# Patient Record
Sex: Male | Born: 1955
Health system: Southern US, Community
[De-identification: ages and names within clinical notes are randomized; demographics above are authoritative.]

## PROBLEM LIST (undated history)

## (undated) DIAGNOSIS — C6290 Malignant neoplasm of unspecified testis, unspecified whether descended or undescended: Secondary | ICD-10-CM

## (undated) DIAGNOSIS — Z8619 Personal history of other infectious and parasitic diseases: Secondary | ICD-10-CM

## (undated) DIAGNOSIS — B1920 Unspecified viral hepatitis C without hepatic coma: Secondary | ICD-10-CM

## (undated) DIAGNOSIS — I10 Essential (primary) hypertension: Secondary | ICD-10-CM

## (undated) HISTORY — DX: Personal history of other infectious and parasitic diseases: Z86.19

## (undated) HISTORY — DX: Unspecified viral hepatitis C without hepatic coma: B19.20

---

## 1992-06-26 HISTORY — PX: TESTICLE REMOVAL: SHX68

## 2005-03-14 ENCOUNTER — Ambulatory Visit: Payer: Self-pay | Admitting: Gastroenterology

## 2005-04-11 ENCOUNTER — Ambulatory Visit: Payer: Self-pay | Admitting: Gastroenterology

## 2005-06-15 ENCOUNTER — Ambulatory Visit: Payer: Self-pay | Admitting: Gastroenterology

## 2005-06-29 ENCOUNTER — Ambulatory Visit: Payer: Self-pay | Admitting: Gastroenterology

## 2006-03-22 ENCOUNTER — Ambulatory Visit: Payer: Self-pay | Admitting: Gastroenterology

## 2006-04-26 ENCOUNTER — Ambulatory Visit: Payer: Self-pay | Admitting: Gastroenterology

## 2006-12-11 ENCOUNTER — Ambulatory Visit: Payer: Self-pay | Admitting: Gastroenterology

## 2007-10-17 ENCOUNTER — Ambulatory Visit: Payer: Self-pay | Admitting: Gastroenterology

## 2009-07-12 ENCOUNTER — Ambulatory Visit: Payer: Self-pay | Admitting: Family Medicine

## 2009-11-11 ENCOUNTER — Ambulatory Visit: Payer: Self-pay | Admitting: Gastroenterology

## 2009-11-11 HISTORY — PX: LIVER BIOPSY: SHX301

## 2013-03-28 ENCOUNTER — Ambulatory Visit: Payer: Self-pay | Admitting: Gastroenterology

## 2013-09-08 ENCOUNTER — Ambulatory Visit: Payer: Self-pay | Admitting: Gastroenterology

## 2013-10-03 ENCOUNTER — Ambulatory Visit: Payer: Self-pay | Admitting: Gastroenterology

## 2013-10-06 LAB — HM COLONOSCOPY

## 2013-10-07 LAB — PATHOLOGY REPORT

## 2014-03-03 DIAGNOSIS — Z86010 Personal history of colonic polyps: Secondary | ICD-10-CM | POA: Insufficient documentation

## 2014-03-03 DIAGNOSIS — Z860101 Personal history of adenomatous and serrated colon polyps: Secondary | ICD-10-CM | POA: Insufficient documentation

## 2014-03-03 DIAGNOSIS — Z8619 Personal history of other infectious and parasitic diseases: Secondary | ICD-10-CM | POA: Insufficient documentation

## 2014-03-04 ENCOUNTER — Ambulatory Visit: Payer: Self-pay | Admitting: Gastroenterology

## 2014-03-13 LAB — BASIC METABOLIC PANEL
BUN: 16 mg/dL (ref 4–21)
Creatinine: 1.1 mg/dL (ref 0.6–1.3)
Glucose: 91 mg/dL
Potassium: 5 mmol/L (ref 3.4–5.3)
Sodium: 142 mmol/L (ref 137–147)

## 2014-03-13 LAB — HEPATIC FUNCTION PANEL
ALT: 28 U/L (ref 10–40)
AST: 25 U/L (ref 14–40)

## 2014-03-13 LAB — PSA: PSA: 0.3

## 2015-04-21 ENCOUNTER — Other Ambulatory Visit: Payer: Self-pay | Admitting: Gastroenterology

## 2015-04-21 DIAGNOSIS — K7400 Hepatic fibrosis, unspecified: Secondary | ICD-10-CM

## 2015-09-28 ENCOUNTER — Emergency Department: Payer: BLUE CROSS/BLUE SHIELD

## 2015-09-28 ENCOUNTER — Encounter: Payer: Self-pay | Admitting: Emergency Medicine

## 2015-09-28 ENCOUNTER — Observation Stay
Admission: EM | Admit: 2015-09-28 | Discharge: 2015-09-29 | Disposition: A | Discharge status: Home / Self Care | Payer: BLUE CROSS/BLUE SHIELD | Attending: Internal Medicine | Admitting: Internal Medicine

## 2015-09-28 ENCOUNTER — Encounter
Admission: EM | Disposition: A | Discharge status: Home / Self Care | Payer: Self-pay | Source: Home / Self Care | Attending: Emergency Medicine

## 2015-09-28 ENCOUNTER — Inpatient Hospital Stay
Admit: 2015-09-28 | Discharge: 2015-09-28 | Disposition: A | Discharge status: Home / Self Care | Payer: BLUE CROSS/BLUE SHIELD | Attending: Internal Medicine | Admitting: Internal Medicine

## 2015-09-28 DIAGNOSIS — R000 Tachycardia, unspecified: Secondary | ICD-10-CM | POA: Insufficient documentation

## 2015-09-28 DIAGNOSIS — I493 Ventricular premature depolarization: Secondary | ICD-10-CM | POA: Diagnosis not present

## 2015-09-28 DIAGNOSIS — R079 Chest pain, unspecified: Secondary | ICD-10-CM | POA: Diagnosis present

## 2015-09-28 DIAGNOSIS — I214 Non-ST elevation (NSTEMI) myocardial infarction: Secondary | ICD-10-CM | POA: Diagnosis not present

## 2015-09-28 DIAGNOSIS — R42 Dizziness and giddiness: Secondary | ICD-10-CM | POA: Diagnosis not present

## 2015-09-28 DIAGNOSIS — I25110 Atherosclerotic heart disease of native coronary artery with unstable angina pectoris: Secondary | ICD-10-CM | POA: Insufficient documentation

## 2015-09-28 DIAGNOSIS — E785 Hyperlipidemia, unspecified: Secondary | ICD-10-CM | POA: Diagnosis not present

## 2015-09-28 DIAGNOSIS — R0602 Shortness of breath: Secondary | ICD-10-CM | POA: Insufficient documentation

## 2015-09-28 DIAGNOSIS — R778 Other specified abnormalities of plasma proteins: Secondary | ICD-10-CM | POA: Insufficient documentation

## 2015-09-28 DIAGNOSIS — E876 Hypokalemia: Secondary | ICD-10-CM | POA: Diagnosis not present

## 2015-09-28 DIAGNOSIS — R072 Precordial pain: Secondary | ICD-10-CM | POA: Insufficient documentation

## 2015-09-28 DIAGNOSIS — I255 Ischemic cardiomyopathy: Secondary | ICD-10-CM | POA: Diagnosis not present

## 2015-09-28 DIAGNOSIS — I371 Nonrheumatic pulmonary valve insufficiency: Secondary | ICD-10-CM | POA: Insufficient documentation

## 2015-09-28 DIAGNOSIS — I081 Rheumatic disorders of both mitral and tricuspid valves: Secondary | ICD-10-CM | POA: Diagnosis not present

## 2015-09-28 DIAGNOSIS — Z8547 Personal history of malignant neoplasm of testis: Secondary | ICD-10-CM | POA: Insufficient documentation

## 2015-09-28 DIAGNOSIS — Z8249 Family history of ischemic heart disease and other diseases of the circulatory system: Secondary | ICD-10-CM | POA: Diagnosis not present

## 2015-09-28 DIAGNOSIS — I10 Essential (primary) hypertension: Secondary | ICD-10-CM | POA: Diagnosis not present

## 2015-09-28 DIAGNOSIS — R61 Generalized hyperhidrosis: Secondary | ICD-10-CM | POA: Insufficient documentation

## 2015-09-28 HISTORY — DX: Malignant neoplasm of unspecified testis, unspecified whether descended or undescended: C62.90

## 2015-09-28 HISTORY — PX: CARDIAC CATHETERIZATION: SHX172

## 2015-09-28 HISTORY — DX: Essential (primary) hypertension: I10

## 2015-09-28 LAB — BASIC METABOLIC PANEL
Anion gap: 10 (ref 5–15)
BUN: 16 mg/dL (ref 6–20)
CO2: 21 mmol/L — ABNORMAL LOW (ref 22–32)
Calcium: 8.7 mg/dL — ABNORMAL LOW (ref 8.9–10.3)
Chloride: 104 mmol/L (ref 101–111)
Creatinine, Ser: 0.89 mg/dL (ref 0.61–1.24)
GFR calc Af Amer: >60 mL/min (ref >60)
GFR calc non Af Amer: >60 mL/min (ref >60)
Glucose, Bld: 120 mg/dL — ABNORMAL HIGH (ref 65–99)
Potassium: 3.3 mmol/L — ABNORMAL LOW (ref 3.5–5.1)
Sodium: 135 mmol/L (ref 135–145)

## 2015-09-28 LAB — TROPONIN I
Troponin I: 0.06 ng/mL — ABNORMAL HIGH (ref <0.031)
Troponin I: 0.05 ng/mL — ABNORMAL HIGH (ref <0.031)

## 2015-09-28 LAB — CBC
HCT: 42.6 % (ref 40.0–52.0)
Hemoglobin: 14.6 g/dL (ref 13.0–18.0)
MCH: 31.7 pg (ref 26.0–34.0)
MCHC: 34.2 g/dL (ref 32.0–36.0)
MCV: 92.8 fL (ref 80.0–100.0)
Platelets: 137 10*3/uL — ABNORMAL LOW (ref 150–440)
RBC: 4.59 MIL/uL (ref 4.40–5.90)
RDW: 13.4 % (ref 11.5–14.5)
WBC: 12.5 10*3/uL — ABNORMAL HIGH (ref 3.8–10.6)

## 2015-09-28 SURGERY — LEFT HEART CATH AND CORONARY ANGIOGRAPHY
Anesthesia: Moderate Sedation | Laterality: Right

## 2015-09-28 MED ORDER — LISINOPRIL 20 MG PO TABS
20.0000 mg | ORAL_TABLET | Freq: Every day | ORAL | Status: DC
  Administered 2015-09-28 – 2015-09-29 (×2): 20 mg via ORAL
  Filled 2015-09-28 (×2): qty 1

## 2015-09-28 MED ORDER — SODIUM CHLORIDE 0.9% FLUSH
3.0000 mL | Freq: Two times a day (BID) | INTRAVENOUS | Status: DC
  Administered 2015-09-28: 3 mL via INTRAVENOUS

## 2015-09-28 MED ORDER — TICAGRELOR 90 MG PO TABS
ORAL_TABLET | ORAL | Status: AC
  Filled 2015-09-28: qty 2

## 2015-09-28 MED ORDER — ACETAMINOPHEN 325 MG PO TABS: 650.0000 mg | ORAL_TABLET | ORAL | Status: DC | PRN

## 2015-09-28 MED ORDER — HEPARIN (PORCINE) IN NACL 2-0.9 UNIT/ML-% IJ SOLN
INTRAMUSCULAR | Status: AC
  Filled 2015-09-28: qty 500

## 2015-09-28 MED ORDER — FENTANYL CITRATE (PF) 100 MCG/2ML IJ SOLN
INTRAMUSCULAR | Status: AC
  Filled 2015-09-28: qty 2

## 2015-09-28 MED ORDER — ASPIRIN 81 MG PO CHEW: 81.0000 mg | CHEWABLE_TABLET | ORAL | Status: DC

## 2015-09-28 MED ORDER — SODIUM CHLORIDE 0.9 % IV SOLN: 250.0000 mL | INTRAVENOUS | Status: DC | PRN

## 2015-09-28 MED ORDER — ASPIRIN 81 MG PO CHEW
CHEWABLE_TABLET | ORAL | Status: AC
  Filled 2015-09-28: qty 4

## 2015-09-28 MED ORDER — SODIUM CHLORIDE 0.9% FLUSH: 3.0000 mL | INTRAVENOUS | Status: DC | PRN

## 2015-09-28 MED ORDER — ONDANSETRON HCL 4 MG/2ML IJ SOLN: 4.0000 mg | Freq: Four times a day (QID) | INTRAMUSCULAR | Status: DC | PRN

## 2015-09-28 MED ORDER — IOPAMIDOL (ISOVUE-300) INJECTION 61%
INTRAVENOUS | Status: DC | PRN
  Administered 2015-09-28: 140 mL via INTRA_ARTERIAL

## 2015-09-28 MED ORDER — POTASSIUM CHLORIDE CRYS ER 20 MEQ PO TBCR
40.0000 meq | EXTENDED_RELEASE_TABLET | Freq: Once | ORAL | Status: AC
Start: 2015-09-28 — End: 2015-09-28
  Administered 2015-09-28: 40 meq via ORAL
  Filled 2015-09-28: qty 2

## 2015-09-28 MED ORDER — LABETALOL HCL 5 MG/ML IV SOLN
20.0000 mg | Freq: Once | INTRAVENOUS | Status: AC
  Administered 2015-09-28: 20 mg via INTRAVENOUS
  Filled 2015-09-28: qty 4

## 2015-09-28 MED ORDER — SODIUM CHLORIDE 0.9 % WEIGHT BASED INFUSION
1.0000 mL/kg/h | INTRAVENOUS | Status: AC
  Administered 2015-09-28: 1 mL/kg/h via INTRAVENOUS

## 2015-09-28 MED ORDER — SODIUM CHLORIDE 0.9 % WEIGHT BASED INFUSION: 1.0000 mL/kg/h | INTRAVENOUS | Status: DC

## 2015-09-28 MED ORDER — TICAGRELOR 90 MG PO TABS
ORAL_TABLET | ORAL | Status: DC | PRN
  Administered 2015-09-28: 180 mg via ORAL

## 2015-09-28 MED ORDER — NITROGLYCERIN 0.4 MG SL SUBL: 0.4000 mg | SUBLINGUAL_TABLET | SUBLINGUAL | Status: DC | PRN

## 2015-09-28 MED ORDER — ASPIRIN 81 MG PO CHEW: 324.0000 mg | CHEWABLE_TABLET | ORAL | Status: DC

## 2015-09-28 MED ORDER — MIDAZOLAM HCL 2 MG/2ML IJ SOLN
INTRAMUSCULAR | Status: AC
  Filled 2015-09-28: qty 2

## 2015-09-28 MED ORDER — TICAGRELOR 90 MG PO TABS
90.0000 mg | ORAL_TABLET | Freq: Two times a day (BID) | ORAL | Status: DC
  Administered 2015-09-28 – 2015-09-29 (×3): 90 mg via ORAL
  Filled 2015-09-28 (×3): qty 1

## 2015-09-28 MED ORDER — ASPIRIN 81 MG PO CHEW
324.0000 mg | CHEWABLE_TABLET | Freq: Once | ORAL | Status: AC
  Administered 2015-09-28: 324 mg via ORAL

## 2015-09-28 MED ORDER — ENOXAPARIN SODIUM 40 MG/0.4ML ~~LOC~~ SOLN
40.0000 mg | SUBCUTANEOUS | Status: DC
  Administered 2015-09-29: 40 mg via SUBCUTANEOUS
  Filled 2015-09-28: qty 0.4

## 2015-09-28 MED ORDER — ISOSORBIDE MONONITRATE ER 30 MG PO TB24
30.0000 mg | ORAL_TABLET | Freq: Every day | ORAL | Status: DC
  Administered 2015-09-28 – 2015-09-29 (×2): 30 mg via ORAL
  Filled 2015-09-28 (×2): qty 1

## 2015-09-28 MED ORDER — METOPROLOL TARTRATE 25 MG PO TABS
12.5000 mg | ORAL_TABLET | Freq: Two times a day (BID) | ORAL | Status: DC
  Administered 2015-09-28 – 2015-09-29 (×3): 12.5 mg via ORAL
  Filled 2015-09-28 (×5): qty 1

## 2015-09-28 MED ORDER — MIDAZOLAM HCL 2 MG/2ML IJ SOLN
INTRAMUSCULAR | Status: DC | PRN
  Administered 2015-09-28 (×2): 1 mg via INTRAVENOUS

## 2015-09-28 MED ORDER — LISINOPRIL 10 MG PO TABS
5.0000 mg | ORAL_TABLET | Freq: Every day | ORAL | Status: DC
Start: 2015-09-28 — End: 2015-09-28

## 2015-09-28 MED ORDER — ASPIRIN 300 MG RE SUPP: 300.0000 mg | RECTAL | Status: DC

## 2015-09-28 MED ORDER — FENTANYL CITRATE (PF) 100 MCG/2ML IJ SOLN
INTRAMUSCULAR | Status: DC | PRN
  Administered 2015-09-28: 50 ug via INTRAVENOUS

## 2015-09-28 MED ORDER — ATORVASTATIN CALCIUM 20 MG PO TABS
80.0000 mg | ORAL_TABLET | Freq: Every day | ORAL | Status: DC
  Administered 2015-09-28: 80 mg via ORAL
  Filled 2015-09-28: qty 4

## 2015-09-28 MED ORDER — SODIUM CHLORIDE 0.9 % WEIGHT BASED INFUSION: 3.0000 mL/kg/h | INTRAVENOUS | Status: DC

## 2015-09-28 MED ORDER — ASPIRIN EC 81 MG PO TBEC
81.0000 mg | DELAYED_RELEASE_TABLET | Freq: Every day | ORAL | Status: DC
  Administered 2015-09-29: 81 mg via ORAL
  Filled 2015-09-28: qty 1

## 2015-09-28 MED ORDER — NITROGLYCERIN 2 % TD OINT
TOPICAL_OINTMENT | TRANSDERMAL | Status: AC
  Filled 2015-09-28: qty 1

## 2015-09-28 MED ORDER — NITROGLYCERIN 2 % TD OINT
1.0000 [in_us] | TOPICAL_OINTMENT | Freq: Once | TRANSDERMAL | Status: AC
  Administered 2015-09-28: 1 [in_us] via TOPICAL

## 2015-09-28 SURGICAL SUPPLY — 10 items
CATH INFINITI 5FR ANG PIGTAIL (CATHETERS) ×2 IMPLANT
CATH INFINITI 5FR JL4 (CATHETERS) ×2 IMPLANT
CATH INFINITI JR4 5F (CATHETERS) ×2 IMPLANT
DEVICE CLOSURE MYNXGRIP 5F (Vascular Products) ×1 IMPLANT
KIT MANI 3VAL PERCEP (MISCELLANEOUS) ×2 IMPLANT
NDL PERC 18GX7CM (NEEDLE) ×1 IMPLANT
NEEDLE PERC 18GX7CM (NEEDLE) ×2 IMPLANT
PACK CARDIAC CATH (CUSTOM PROCEDURE TRAY) ×2 IMPLANT
SHEATH PINNACLE 5F 10CM (SHEATH) ×2 IMPLANT
WIRE EMERALD 3MM-J .035X150CM (WIRE) ×2 IMPLANT

## 2015-09-28 NOTE — Progress Notes (Signed)
Patient ambulated around the nursing station 3 times. Patient HR sustaining in the 80s. No complaints of pain or SOB. Will continue to monitor patient Bryan Shea

## 2015-09-28 NOTE — Consult Note (Signed)
Bryan Shea is a 60 y.o. male  [id number]  Primary Cardiologist: Neoma Laming Reason for Consultation: Chest Pain   HPI: Bryan Shea is a 60 year old male with known history of hypertension, testicular cancer who presented to the emergency room with chest pain. His chest pain was substernal in nature "like elephant sitting on my chest" and nonradiating severity of 10 out of 10 on a scale of 1-10, and accompanied by diaphoresis and shortness of breath. This had awakened him from a nap around 6 PM and subsequently improved with worsening later in the evening for which he presented to the emergency department. Pt without history of cardiac problems or MI. No bleeding disorders or recent unusual bleeding.   Review of Systems: Currently chest pain free, no shortness of breath, no dizziness, no palpitations   Past Medical History  Diagnosis Date  . Hypertension   . Testicular cancer ([place])     Medications Prior to Admission  Medication Sig Dispense Refill  . acetaminophen (TYLENOL) 325 MG tablet Take 650 mg by mouth every 6 (six) hours as needed.    . Multiple Vitamin (MULTIVITAMIN WITH MINERALS) TABS tablet Take 1 tablet by mouth daily.       Marland Kitchen aspirin  324 mg Oral NOW   Or  . aspirin  300 mg Rectal NOW  . [START ON 09/29/2015] aspirin EC  81 mg Oral Daily  . enoxaparin (LOVENOX) injection  40 mg Subcutaneous Q24H  . metoprolol tartrate  12.5 mg Oral BID  . sodium chloride flush  3 mL Intravenous Q12H    Infusions:    No Known Allergies  Social History   Social History  . Marital Status: Single    Spouse Name: N/A  . Number of Children: N/A  . Years of Education: N/A   Occupational History  . Works for [employer]    Social History Main Topics  . Smoking status: Never Smoker   . Smokeless tobacco: Never Used  . Alcohol Use: Yes  . Drug Use: No  . Sexual Activity: Not on file   Other Topics Concern  . Not on file   Social History Narrative  . No  narrative on file    Family History  Problem Relation Age of Onset  . Heart failure Father     deceased    PHYSICAL EXAM: Filed Vitals:   09/28/15 0730 09/28/15 0835  BP: 136/92 145/104  Pulse: 74 84  Temp:  98.1 F (36.7 C)  Resp: 20 18    No intake or output data in the 24 hours ending 09/28/15 0845  General:  Well appearing. No respiratory difficulty HEENT: normal Neck: supple. no JVD. Carotids 2+ bilat; no bruits. No lymphadenopathy or thryomegaly appreciated. Cor: PMI nondisplaced. Regular rate & rhythm. No rubs, gallops or murmurs. Lungs: clear Abdomen: soft, nontender, nondistended. No hepatosplenomegaly. No bruits or masses. Good bowel sounds. Extremities: no cyanosis, clubbing, rash, edema Neuro: alert & oriented x 3, cranial nerves grossly intact. moves all 4 extremities w/o difficulty. Affect pleasant.  ECG: Sinus rhythm with frequent PVC's  Results for orders placed or performed during the hospital encounter of 09/28/15 (from the past 24 hour(s))  Basic metabolic panel     Status: Abnormal   Collection Time: 09/28/15  1:35 AM  Result Value Ref Range   Sodium 135 135 - 145 mmol/L   Potassium 3.3 (L) 3.5 - 5.1 mmol/L   Chloride 104 101 - 111 mmol/L   CO2 21 (L) 22 -  32 mmol/L   Glucose, Bld 120 (H) 65 - 99 mg/dL   BUN 16 6 - 20 mg/dL   Creatinine, Ser 0.89 0.61 - 1.24 mg/dL   Calcium 8.7 (L) 8.9 - 10.3 mg/dL   GFR calc non Af Amer >60 >60 mL/min   GFR calc Af Amer >60 >60 mL/min   Anion gap 10 5 - 15  CBC     Status: Abnormal   Collection Time: 09/28/15  1:35 AM  Result Value Ref Range   WBC 12.5 (H) 3.8 - 10.6 K/uL   RBC 4.59 4.40 - 5.90 MIL/uL   Hemoglobin 14.6 13.0 - 18.0 g/dL   HCT 42.6 40.0 - 52.0 %   MCV 92.8 80.0 - 100.0 fL   MCH 31.7 26.0 - 34.0 pg   MCHC 34.2 32.0 - 36.0 g/dL   RDW 13.4 11.5 - 14.5 %   Platelets 137 (L) 150 - 440 K/uL  Troponin I     Status: Abnormal   Collection Time: 09/28/15  1:35 AM  Result Value Ref Range    Troponin I 0.06 (H) <0.031 ng/mL  Troponin I     Status: Abnormal   Collection Time: 09/28/15  4:23 AM  Result Value Ref Range   Troponin I 0.05 (H) <0.031 ng/mL   Dg Chest 2 View  09/28/2015  CLINICAL DATA:  Sternal chest pain and pressure, onset last evening. Dizziness. EXAM: CHEST  2 VIEW COMPARISON:  None. FINDINGS: The cardiomediastinal contours are normal, heart at the upper limits of normal in size. The lungs are clear. Pulmonary vasculature is normal. No consolidation, pleural effusion, or pneumothorax. No acute osseous abnormalities are seen. Degenerative change in the included lumbar spine. IMPRESSION: No acute pulmonary process. Electronically Signed   By: Jeb Levering M.D.   On: 09/28/2015 02:23     ASSESSMENT AND PLAN: Unstable angina with new onset severe substernal chest pain associated with shortness of breath and diaphoresis. Mildly elevated troponins, inappropriate for stress test. Will proceed with left heart cath and angiography. Discussed with patient and he agrees with plan.  Daune Perch, NP 09/28/2015 8:45 AM

## 2015-09-28 NOTE — ED Provider Notes (Signed)
Time Seen: Approximately 305  I have reviewed the triage notes  Chief Complaint: Chest Pain   History of Present Illness: Bryan Shea is a 60 y.o. male who presents with 2 separate episodes of substernal chest discomfort. Patient states he got home from work yesterday afternoon at about 6:00 laid down to take a nap. He states he was awoken from the nap with a feeling of chest heaviness and pressure on his chest. He denies any radiation of discomfort to the arm or jaw area or to the back. Use and states that he was diaphoretic at that time with some nausea and mild shortness of breath. He states he took some neck serum and the symptoms seemed to resolve and then he went to bed and states he started having return of the chest pressure approximately 1 AM. He states at work he said some shortness of breath with exertion which is somewhat new for him over the past week. He has a history of hypertension and was on lisinopril but when his insurance changed to stop taking the medication. He denies any significant discomfort at this time just a mild pressure. He denies any current nausea or vomiting. He states he has not had any objective studies to evaluate his heart.   Past Medical History  Diagnosis Date  . Hypertension   . Testicular cancer ([place])     There are no active problems to display for this patient.   Past Surgical History  Procedure Laterality Date  . Testicle removal      Past Surgical History  Procedure Laterality Date  . Testicle removal      No current outpatient prescriptions on file.  Allergies:  Review of patient's allergies indicates no known allergies.  Family History: No family history on file.  Social History: Social History  Substance Use Topics  . Smoking status: Never Smoker   . Smokeless tobacco: Never Used  . Alcohol Use: Yes     Review of Systems:   10 point review of systems was performed and was otherwise negative:  Constitutional:  No fever Eyes: No visual disturbances ENT: No sore throat, ear pain Cardiac: Mild substernal chest pressure that has a slight pleuritic component Respiratory: No shortness of breath, wheezing, or stridor Abdomen: No abdominal pain, no vomiting, No diarrhea Endocrine: No weight loss, No night sweats Extremities: No peripheral edema, cyanosis Skin: No rashes, easy bruising Neurologic: No focal weakness, trouble with speech or swollowing Urologic: No dysuria, Hematuria, or urinary frequency   Physical Exam:  ED Triage Vitals  Enc Vitals Group     BP 09/28/15 0255 173/126 mmHg     Pulse Rate 09/28/15 0255 99     Resp 09/28/15 0255 24     Temp --      Temp src --      SpO2 09/28/15 0255 97 %     Weight 09/28/15 0304 230 lb (104.327 kg)     Height 09/28/15 0304 6\' 3"  (1.905 m)     Head Cir --      Peak Flow --      Pain Score 09/28/15 0127 6     Pain Loc --      Pain Edu? --      Excl. in [place]? --     General: Awake , Alert , and Oriented times 3; GCS 15 Head: Normal cephalic , atraumatic Eyes: Pupils equal , round, reactive to light Nose/Throat: No nasal drainage, patent upper airway without erythema  or exudate.  Neck: Supple, Full range of motion, No anterior adenopathy or palpable thyroid masses Lungs: Clear to ascultation without wheezes , rhonchi, or rales Heart: Regular rate, regular rhythm without murmurs , gallops , or rubs Abdomen: Soft, non tender without rebound, guarding , or rigidity; bowel sounds positive and symmetric in all 4 quadrants. No organomegaly .        Extremities: 2 plus symmetric pulses. No edema, clubbing or cyanosis Neurologic: normal ambulation, Motor symmetric without deficits, sensory intact Skin: warm, dry, no rashes There is mild reproducible pain with palpation across his sternal region  Labs:   All laboratory work was reviewed including any pertinent negatives or positives listed below:  Labs Reviewed  BASIC METABOLIC PANEL - Abnormal;  Notable for the following:    Potassium 3.3 (*)    CO2 21 (*)    Glucose, Bld 120 (*)    Calcium 8.7 (*)    All other components within normal limits  CBC - Abnormal; Notable for the following:    WBC 12.5 (*)    Platelets 137 (*)    All other components within normal limits  TROPONIN I - Abnormal; Notable for the following:    Troponin I 0.06 (*)    All other components within normal limits    EKG: * ED ECG REPORT I, Daymon Larsen, the attending physician, personally viewed and interpreted this ECG.  Date: 09/28/2015 EKG Time: 0125 Rate: 106 Rhythm: Sinus tachycardia with frequent unifocal PVCs QRS Axis: normal Intervals: normal ST/T Wave abnormalities: normal Conduction Disturbances: none Narrative Interpretation:  Left atrial enlargement Left ventricular hypertrophy with left axis deviation  Radiology:  EXAM: CHEST 2 VIEW  COMPARISON: None.  FINDINGS: The cardiomediastinal contours are normal, heart at the upper limits of normal in size. The lungs are clear. Pulmonary vasculature is normal. No consolidation, pleural effusion, or pneumothorax. No acute osseous abnormalities are seen. Degenerative change in the included lumbar spine.  IMPRESSION: No acute pulmonary process.     I personally reviewed the radiologic studies    ED Course:  Patient's stay here was uneventful he arrives hypertensive with some mild chest discomfort he had an inch of nitroglycerin paste applied and aspirin therapy. He still remained hypertensive and was given labetalol 20 mg IV. Heme to respond very well to the medication at the time of disposition his blood pressure is 132/92. Serial troponins were performed and remained elevated though decreased slightly on the second troponin. His history is concerning for acute coronary syndrome. The patient's case was reviewed with the hospitalist team, further disposition and management depends upon their evaluation.   Assessment: * Acute  unspecified chest pain Hypertensive urgency      Plan: * Inpatient management           Daymon Larsen, MD 09/28/15 [phone]

## 2015-09-28 NOTE — H&P (Signed)
[facility] [place] NAME: Bryan Shea    MR#:  [id number]  DATE OF BIRTH:  [birth year]  DATE OF ADMISSION:  09/28/2015  PRIMARY CARE PHYSICIAN: Lelon Huh, MD   REQUESTING/REFERRING PHYSICIAN:   CHIEF COMPLAINT:   Chief Complaint  Patient presents with  . Chest Pain    HISTORY OF PRESENT ILLNESS: Bryan Shea  is a 60 y.o. male with a known history of Hypertension, testicular cancer presented to the emergency room with chest pain since yesterday. Patient came back from work and he took a nap and after waking up he had some retrosternal chest pain. He felt pressure-like sensation in the center of the chest which was 10 out of 10 on a scale of 1-10. No radiation of the pain noted. No history of cough fever or chills. No history of any shortness of breath. No orthopnea or proximal nocturnal dyspnea. Patient never had any cardiac workup done in the past. Troponin first 2 sets have been borderline.  PAST MEDICAL HISTORY:   Past Medical History  Diagnosis Date  . Hypertension   . Testicular cancer ([place])     PAST SURGICAL HISTORY: Past Surgical History  Procedure Laterality Date  . Testicle removal      SOCIAL HISTORY:  Social History  Substance Use Topics  . Smoking status: Never Smoker   . Smokeless tobacco: Never Used  . Alcohol Use: Yes    FAMILY HISTORY:  Family History  Problem Relation Age of Onset  . Heart failure Father     deceased    DRUG ALLERGIES: No Known Allergies  REVIEW OF SYSTEMS:   CONSTITUTIONAL: No fever, fatigue or weakness.  EYES: No blurred or double vision.  EARS, NOSE, AND THROAT: No tinnitus or ear pain.  RESPIRATORY: No cough, shortness of breath, wheezing or hemoptysis.  CARDIOVASCULAR: Has chest pain, no orthopnea,no edema.  GASTROINTESTINAL: No nausea, vomiting, diarrhea or abdominal pain.  GENITOURINARY: No dysuria, hematuria.  ENDOCRINE: No polyuria, nocturia,  HEMATOLOGY:  No anemia, easy bruising or bleeding SKIN: No rash or lesion. MUSCULOSKELETAL: No joint pain or arthritis.   NEUROLOGIC: No tingling, numbness, weakness.  PSYCHIATRY: No anxiety or depression.   MEDICATIONS AT HOME:  Prior to Admission medications   Not on File      PHYSICAL EXAMINATION:   VITAL SIGNS: Blood pressure 133/96, pulse 76, resp. rate 16, height 6\' 3"  (1.905 m), weight 104.327 kg (230 lb), SpO2 95 %.  GENERAL:  60 y.o.-year-old patient lying in the bed with no acute distress.  EYES: Pupils equal, round, reactive to light and accommodation. No scleral icterus. Extraocular muscles intact.  HEENT: Head atraumatic, normocephalic. Oropharynx and nasopharynx clear.  NECK:  Supple, no jugular venous distention. No thyroid enlargement, no tenderness.  LUNGS: Normal breath sounds bilaterally, no wheezing, rales,rhonchi or crepitation. No use of accessory muscles of respiration.  CARDIOVASCULAR: S1, S2 normal. No murmurs, rubs, or gallops.  ABDOMEN: Soft, nontender, nondistended. Bowel sounds present. No organomegaly or mass.  EXTREMITIES: No pedal edema, cyanosis, or clubbing.  NEUROLOGIC: Cranial nerves II through XII are intact. Muscle strength 5/5 in all extremities. Sensation intact. Gait not checked.  PSYCHIATRIC: The patient is alert and oriented x 3.  SKIN: No obvious rash, lesion, or ulcer.   LABORATORY PANEL:   CBC  Recent Labs Lab 09/28/15 0135  WBC 12.5*  HGB 14.6  HCT 42.6  PLT 137*  MCV 92.8  MCH 31.7  MCHC 34.2  RDW  13.4   ------------------------------------------------------------------------------------------------------------------  Chemistries   Recent Labs Lab 09/28/15 0135  NA 135  K 3.3*  CL 104  CO2 21*  GLUCOSE 120*  BUN 16  CREATININE 0.89  CALCIUM 8.7*   ------------------------------------------------------------------------------------------------------------------ estimated creatinine clearance is 118.2 mL/min (by C-G formula  based on Cr of 0.89). ------------------------------------------------------------------------------------------------------------------ No results for input(s): TSH, T4TOTAL, T3FREE, THYROIDAB in the last 72 hours.  Invalid input(s): FREET3   Coagulation profile No results for input(s): INR, PROTIME in the last 168 hours. ------------------------------------------------------------------------------------------------------------------- No results for input(s): DDIMER in the last 72 hours. -------------------------------------------------------------------------------------------------------------------  Cardiac Enzymes  Recent Labs Lab 09/28/15 0135 09/28/15 0423  TROPONINI 0.06* 0.05*   ------------------------------------------------------------------------------------------------------------------ Invalid input(s): POCBNP  ---------------------------------------------------------------------------------------------------------------  Urinalysis No results found for: COLORURINE, APPEARANCEUR, LABSPEC, PHURINE, GLUCOSEU, HGBUR, BILIRUBINUR, KETONESUR, PROTEINUR, UROBILINOGEN, NITRITE, LEUKOCYTESUR   RADIOLOGY: Dg Chest 2 View  09/28/2015  CLINICAL DATA:  Sternal chest pain and pressure, onset last evening. Dizziness. EXAM: CHEST  2 VIEW COMPARISON:  None. FINDINGS: The cardiomediastinal contours are normal, heart at the upper limits of normal in size. The lungs are clear. Pulmonary vasculature is normal. No consolidation, pleural effusion, or pneumothorax. No acute osseous abnormalities are seen. Degenerative change in the included lumbar spine. IMPRESSION: No acute pulmonary process. Electronically Signed   By: Jeb Levering M.D.   On: 09/28/2015 02:23    EKG: Orders placed or performed during the hospital encounter of 09/28/15  . EKG 12-Lead  . EKG 12-Lead  . ED EKG within 10 minutes  . ED EKG within 10 minutes    IMPRESSION AND PLAN: 60 year old male patient with  history of hypertension, testicular cancer presented to the emergency room with chest pain. Admitting diagnosis 1. Chest pain rule out acute coronary syndrome 2. Hypokalemia 3.Hypertension Treatment plan Admit patient to telemetry observation bed Aspirin 81 mg daily Replace oral potassium Control blood pressure with oral metoprolol Check troponin and echocardiogram Cardiology consultation.    All the records are reviewed and case discussed with ED provider. Management plans discussed with the patient, family and they are in agreement.  CODE STATUS:FULL Code Status History    This patient does not have a recorded code status. Please follow your organizational policy for patients in this situation.       TOTAL TIME TAKING CARE OF THIS PATIENT: 50 minutes.    Saundra Shelling M.D on 09/28/2015 at 6:12 AM  Between 7am to 6pm - Pager - [phone]  After 6pm go to [email or web address] - password [email or web address] [facility]  Office  [phone]  CC: Primary care physician; Lelon Huh, MD

## 2015-09-28 NOTE — Progress Notes (Signed)
Patient ID: Bryan Shea, male   DOB: 1956-01-30, 60 y.o.   MRN: [id number] [facility] PROGRESS NOTE  Bryan Shea [id number] DOB: 24-May-1956 DOA: 09/28/2015 PCP: Lelon Huh, MD  HPI/Subjective: Patient feeling better now on offers no complaints. He did have chest pain that brought him into the hospital.  Objective: Filed Vitals:   09/28/15 1119 09/28/15 1217  BP: 116/81 126/88  Pulse: 70 70  Temp: 97.9 F (36.6 C) 98.2 F (36.8 C)  Resp:  18   No intake or output data in the 24 hours ending 09/28/15 1542 Filed Weights   09/28/15 0304  Weight: 104.327 kg (230 lb)    ROS: Review of Systems  Constitutional: Negative for fever and chills.  Eyes: Negative for blurred vision.  Respiratory: Negative for cough and shortness of breath.   Cardiovascular: Positive for chest pain.  Gastrointestinal: Negative for nausea, vomiting, abdominal pain, diarrhea and constipation.  Genitourinary: Negative for dysuria.  Musculoskeletal: Negative for joint pain.  Neurological: Negative for dizziness and headaches.   Exam: Physical Exam  Constitutional: He is oriented to person, place, and time.  HENT:  Nose: No mucosal edema.  Mouth/Throat: No oropharyngeal exudate or posterior oropharyngeal edema.  Eyes: Conjunctivae, EOM and lids are normal. Pupils are equal, round, and reactive to light.  Neck: No JVD present. Carotid bruit is not present. No edema present. No thyroid mass and no thyromegaly present.  Cardiovascular: S1 normal and S2 normal.  Exam reveals no gallop.   No murmur heard. Pulses:      Dorsalis pedis pulses are 2+ on the right side, and 2+ on the left side.  Respiratory: No respiratory distress. He has no wheezes. He has no rhonchi. He has no rales.  GI: Soft. Bowel sounds are normal. There is no tenderness.  Musculoskeletal:       Right ankle: He exhibits no swelling.       Left ankle: He exhibits no swelling.  Lymphadenopathy:    He has no cervical  adenopathy.  Neurological: He is alert and oriented to person, place, and time. No cranial nerve deficit.  Skin: Skin is warm. No rash noted. Nails show no clubbing.  Psychiatric: He has a normal mood and affect.      Data Reviewed: Basic Metabolic Panel:  Recent Labs Lab 09/28/15 0135  NA 135  K 3.3*  CL 104  CO2 21*  GLUCOSE 120*  BUN 16  CREATININE 0.89  CALCIUM 8.7*   CBC:  Recent Labs Lab 09/28/15 0135  WBC 12.5*  HGB 14.6  HCT 42.6  MCV 92.8  PLT 137*   Cardiac Enzymes:  Recent Labs Lab 09/28/15 0135 09/28/15 0423  TROPONINI 0.06* 0.05*     Studies: Dg Chest 2 View  09/28/2015  CLINICAL DATA:  Sternal chest pain and pressure, onset last evening. Dizziness. EXAM: CHEST  2 VIEW COMPARISON:  None. FINDINGS: The cardiomediastinal contours are normal, heart at the upper limits of normal in size. The lungs are clear. Pulmonary vasculature is normal. No consolidation, pleural effusion, or pneumothorax. No acute osseous abnormalities are seen. Degenerative change in the included lumbar spine. IMPRESSION: No acute pulmonary process. Electronically Signed   By: Jeb Levering M.D.   On: 09/28/2015 02:23    Scheduled Meds: . [START ON 09/29/2015] aspirin EC  81 mg Oral Daily  . atorvastatin  80 mg Oral q1800  . enoxaparin (LOVENOX) injection  40 mg Subcutaneous Q24H  . isosorbide mononitrate  30 mg Oral Daily  .  lisinopril  20 mg Oral Daily  . metoprolol tartrate  12.5 mg Oral BID  . sodium chloride flush  3 mL Intravenous Q12H  . sodium chloride flush  3 mL Intravenous Q12H  . ticagrelor  90 mg Oral BID   Continuous Infusions: . sodium chloride 1 mL/kg/hr (09/28/15 1159)    Assessment/Plan:  1. Chest pain concerning for acute coronary syndrome. Troponin elevation. Patient had a catheter that showed 50% blockage in the LAD. Medical management. Aspirin, Brilinta, metoprolol and statin 2. Essential hypertension- blood pressure better controlled  now 3. Hypokalemia- replaced 4. History of testicular cancer  Code Status:     Code Status Orders        Start     Ordered   09/28/15 0841  Full code   Continuous     09/28/15 0840    Code Status History    Date Active Date Inactive Code Status Order ID Comments User Context   This patient has a current code status but no historical code status.     Disposition Plan: Home tomorrow  Consultants:  Cardiology  Time spent: 32 minutes  [place], [place]

## 2015-09-28 NOTE — ED Notes (Signed)
MD at bedside. 

## 2015-09-28 NOTE — ED Notes (Signed)
Pt presents to ED with mid-sternal chest pain/heaviness. Pt states he took a nap after work yesterday evening and was woken by pressure and heaviness in his chest around 1750. Pt reports he was diaphoretic and sob. Pt states symptoms have resolved very little and pt states they worsened again when he went to go to bed. No hx of the same.

## 2015-09-28 NOTE — ED Notes (Signed)
Pt resting in bed, eyes closed, resp even and unlabored, will continue to monitor closely

## 2015-09-28 NOTE — ED Notes (Signed)
Lab called to report elevated troponin of 0.06. Will room as soon as possible. MD notified.

## 2015-09-28 NOTE — Progress Notes (Signed)
Patient had cardiac cath which shows bifurcation mid LAD 50% disease, LCX/RCA normal and LVEF 40%. Advise aggressive medical therapy which has been started. May discharge patient tomoow as needs to watched on medical treatment.

## 2015-09-28 NOTE — Progress Notes (Signed)
Per Dr. Leslye Peer okay cancel troponin lab draws. Will continue to monitor pt. [name]

## 2015-09-29 ENCOUNTER — Telehealth: Payer: Self-pay | Admitting: Family Medicine

## 2015-09-29 ENCOUNTER — Encounter: Payer: Self-pay | Admitting: Family Medicine

## 2015-09-29 DIAGNOSIS — I2510 Atherosclerotic heart disease of native coronary artery without angina pectoris: Secondary | ICD-10-CM | POA: Insufficient documentation

## 2015-09-29 LAB — ECHOCARDIOGRAM COMPLETE
Height: 75 in
Weight: 3680 oz

## 2015-09-29 LAB — BASIC METABOLIC PANEL
Anion gap: 4 — ABNORMAL LOW (ref 5–15)
BUN: 19 mg/dL (ref 6–20)
CO2: 26 mmol/L (ref 22–32)
Calcium: 8.6 mg/dL — ABNORMAL LOW (ref 8.9–10.3)
Chloride: 107 mmol/L (ref 101–111)
Creatinine, Ser: 1.12 mg/dL (ref 0.61–1.24)
GFR calc Af Amer: >60 mL/min (ref >60)
GFR calc non Af Amer: >60 mL/min (ref >60)
Glucose, Bld: 119 mg/dL — ABNORMAL HIGH (ref 65–99)
Potassium: 4 mmol/L (ref 3.5–5.1)
Sodium: 137 mmol/L (ref 135–145)

## 2015-09-29 LAB — LIPID PANEL
Cholesterol: 138 mg/dL (ref 0–200)
HDL: 40 mg/dL — ABNORMAL LOW (ref >40)
LDL Cholesterol: 49 mg/dL (ref 0–99)
Total CHOL/HDL Ratio: 3.5 RATIO
Triglycerides: 243 mg/dL — ABNORMAL HIGH (ref <150)
VLDL: 49 mg/dL — ABNORMAL HIGH (ref 0–40)

## 2015-09-29 MED ORDER — CARVEDILOL 12.5 MG PO TABS
12.5000 mg | ORAL_TABLET | Freq: Two times a day (BID) | ORAL | Status: DC
  Administered 2015-09-29: 12.5 mg via ORAL
  Filled 2015-09-29: qty 1

## 2015-09-29 MED ORDER — METOPROLOL SUCCINATE ER 25 MG PO TB24: 25.0000 mg | ORAL_TABLET | Freq: Every day | ORAL | Status: DC

## 2015-09-29 MED ORDER — NITROGLYCERIN 0.4 MG SL SUBL: 0.4000 mg | SUBLINGUAL_TABLET | SUBLINGUAL | Status: DC | PRN

## 2015-09-29 MED ORDER — CARVEDILOL 12.5 MG PO TABS: 12.5000 mg | ORAL_TABLET | Freq: Two times a day (BID) | ORAL | Status: DC

## 2015-09-29 MED ORDER — TICAGRELOR 90 MG PO TABS: 90.0000 mg | ORAL_TABLET | Freq: Two times a day (BID) | ORAL | Status: DC

## 2015-09-29 MED ORDER — ASPIRIN 81 MG PO TBEC: 81.0000 mg | DELAYED_RELEASE_TABLET | Freq: Every day | ORAL | Status: DC

## 2015-09-29 MED ORDER — ISOSORBIDE MONONITRATE ER 30 MG PO TB24: 30.0000 mg | ORAL_TABLET | Freq: Every day | ORAL | Status: DC

## 2015-09-29 MED ORDER — LISINOPRIL 20 MG PO TABS: 20.0000 mg | ORAL_TABLET | Freq: Every day | ORAL | Status: DC

## 2015-09-29 MED ORDER — ATORVASTATIN CALCIUM 80 MG PO TABS: 80.0000 mg | ORAL_TABLET | Freq: Every day | ORAL | Status: DC

## 2015-09-29 NOTE — Care Management Note (Signed)
Case Management Note  Patient Details  Name: Bryan Shea MRN: [id number] Date of Birth: Jan 27, 1956  Subjective/Objective:   CM consult due to lack of insurance. Spoke with patient at bedside. 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 He has not received any insurance cards yet. Instructed him to call his HR department and [employer] to verify coverage. He denies issues paying for medications that he gets at [facility]. Savings coupon given for Brillinta.  All his medications are on the $4 list at [facility] or less that $20. Patient is ambulatory, independent and continues to work.                Action/Plan: DC pending today. Case Closed  Expected Discharge Date:                  Expected Discharge Plan:  Home/Self Care  In-House Referral:     Discharge planning Services  Medication Assistance, Homebound not met per provider  Post Acute Care Choice:    Choice offered to:     DME Arranged:    DME Agency:     HH Arranged:    HH Agency:     Status of Service:  Completed, signed off  Medicare Important Message Given:    Date Medicare IM Given:    Medicare IM give by:    Date Additional Medicare IM Given:    Additional Medicare Important Message give by:     If discussed at [place] of Stay Meetings, dates discussed:    Additional Comments:  Jolly Mango, RN 09/29/2015, 9:53 AM

## 2015-09-29 NOTE — Telephone Encounter (Signed)
Pt is being DC today 09/29/15 and was treated for chest pains. Pt is scheduled for a hospital f/u on 10/13/15 @ 10am. Thanks TNP

## 2015-09-29 NOTE — Discharge Instructions (Signed)
Heart healthy diet and exercise

## 2015-09-29 NOTE — Discharge Summary (Addendum)
[place] [facility] [place] NAME: Bryan Shea    MR#:  [id number]  DATE OF BIRTH:  [birth year]  DATE OF ADMISSION:  09/28/2015 ADMITTING PHYSICIAN: Saundra Shelling, MD  DATE OF DISCHARGE: 09/29/2015  PRIMARY CARE PHYSICIAN: Lelon Huh, MD    ADMISSION DIAGNOSIS:  Chest pain, unspecified chest pain type [R07.9]  DISCHARGE DIAGNOSIS:  Acute NQWMI s/p cath with CAD in proximal to Mid LAD per cath Cardiomyopathy,Ishcemic with EF 40% (cath) HTN HL SECONDARY DIAGNOSIS:   Past Medical History  Diagnosis Date  . Hypertension   . Testicular cancer [facility])     HOSPITAL COURSE:  1. Acute NQWMI/acute coronary syndrome with Troponin elevation. Patient had a cardiac cath that showed 50% blockage in the LAD. Medical management. Aspirin, Brilinta, metoprolol and statin 2. -pt doing well, ambulated well. No CP or sob. Discussed diet and exercise 3. Essential hypertension- blood pressure better controlled now 4. Hypokalemia- replaced 5. History of testicular cancer 6. Hyperlipidemia on statins  Overall stable D/c home later today  CONSULTS OBTAINED:  Treatment Team:  Dionisio David, MD  DRUG ALLERGIES:  No Known Allergies  DISCHARGE MEDICATIONS:   Current Discharge Medication List    START taking these medications   Details  aspirin EC 81 MG EC tablet Take 1 tablet (81 mg total) by mouth daily. Qty: 30 tablet, Refills: 3    atorvastatin (LIPITOR) 80 MG tablet Take 1 tablet (80 mg total) by mouth daily at 6 PM. Qty: 30 tablet, Refills: 3    carvedilol (COREG) 12.5 MG tablet Take 1 tablet (12.5 mg total) by mouth 2 (two) times daily with a meal. Qty: 60 tablet, Refills: 3    isosorbide mononitrate (IMDUR) 30 MG 24 hr tablet Take 1 tablet (30 mg total) by mouth daily. Qty: 30 tablet, Refills: 3    lisinopril (PRINIVIL,ZESTRIL) 20 MG tablet Take 1 tablet (20 mg total) by mouth daily. Qty: 30 tablet, Refills: 3    nitroGLYCERIN  (NITROSTAT) 0.4 MG SL tablet Place 1 tablet (0.4 mg total) under the tongue every 5 (five) minutes x 3 doses as needed for chest pain. Qty: 20 tablet, Refills: 3    ticagrelor (BRILINTA) 90 MG TABS tablet Take 1 tablet (90 mg total) by mouth 2 (two) times daily. Qty: 60 tablet, Refills: 3      CONTINUE these medications which have NOT CHANGED   Details  acetaminophen (TYLENOL) 325 MG tablet Take 650 mg by mouth every 6 (six) hours as needed.    Multiple Vitamin (MULTIVITAMIN WITH MINERALS) TABS tablet Take 1 tablet by mouth daily.        If you experience worsening of your admission symptoms, develop shortness of breath, life threatening emergency, suicidal or homicidal thoughts you must seek medical attention immediately by calling 911 or calling your MD immediately  if symptoms less severe.  You Must read complete instructions/literature along with all the possible adverse reactions/side effects for all the Medicines you take and that have been prescribed to you. Take any new Medicines after you have completely understood and accept all the possible adverse reactions/side effects.   Please note  You were cared for by a hospitalist during your hospital stay. If you have any questions about your discharge medications or the care you received while you were in the hospital after you are discharged, you can call the unit and asked to speak with the hospitalist on call if the hospitalist that took care of you  is not available. Once you are discharged, your primary care physician will handle any further medical issues. Please note that NO REFILLS for any discharge medications will be authorized once you are discharged, as it is imperative that you return to your primary care physician (or establish a relationship with a primary care physician if you do not have one) for your aftercare needs so that they can reassess your need for medications and monitor your lab values. Today   SUBJECTIVE   No  complaints VITAL SIGNS:  Blood pressure 144/106, pulse 79, temperature 97.8 F (36.6 C), temperature source Oral, resp. rate 20, height 6\' 3"  (1.905 m), weight 104.327 kg (230 lb), SpO2 98 %.  I/O:  No intake or output data in the 24 hours ending 09/29/15 0745  PHYSICAL EXAMINATION:  GENERAL:  60 y.o.-year-old patient lying in the bed with no acute distress.  EYES: Pupils equal, round, reactive to light and accommodation. No scleral icterus. Extraocular muscles intact.  HEENT: Head atraumatic, normocephalic. Oropharynx and nasopharynx clear.  NECK:  Supple, no jugular venous distention. No thyroid enlargement, no tenderness.  LUNGS: Normal breath sounds bilaterally, no wheezing, rales,rhonchi or crepitation. No use of accessory muscles of respiration.  CARDIOVASCULAR: S1, S2 normal. No murmurs, rubs, or gallops.  ABDOMEN: Soft, non-tender, non-distended. Bowel sounds present. No organomegaly or mass.  EXTREMITIES: No pedal edema, cyanosis, or clubbing.  NEUROLOGIC: Cranial nerves II through XII are intact. Muscle strength 5/5 in all extremities. Sensation intact. Gait not checked.  PSYCHIATRIC: The patient is alert and oriented x 3.  SKIN: No obvious rash, lesion, or ulcer.   DATA REVIEW:   CBC   Recent Labs Lab 09/28/15 0135  WBC 12.5*  HGB 14.6  HCT 42.6  PLT 137*    Chemistries   Recent Labs Lab 09/29/15 0437  NA 137  K 4.0  CL 107  CO2 26  GLUCOSE 119*  BUN 19  CREATININE 1.12  CALCIUM 8.6*    Microbiology Results   No results found for this or any previous visit (from the past 240 hour(s)).  RADIOLOGY:  Dg Chest 2 View  09/28/2015  CLINICAL DATA:  Sternal chest pain and pressure, onset last evening. Dizziness. EXAM: CHEST  2 VIEW COMPARISON:  None. FINDINGS: The cardiomediastinal contours are normal, heart at the upper limits of normal in size. The lungs are clear. Pulmonary vasculature is normal. No consolidation, pleural effusion, or pneumothorax. No acute  osseous abnormalities are seen. Degenerative change in the included lumbar spine. IMPRESSION: No acute pulmonary process. Electronically Signed   By: Jeb Levering M.D.   On: 09/28/2015 02:23     Management plans discussed with the patient, family and they are in agreement.  CODE STATUS:     Code Status Orders        Start     Ordered   09/28/15 0841  Full code   Continuous     09/28/15 0840    Code Status History    Date Active Date Inactive Code Status Order ID Comments User Context   This patient has a current code status but no historical code status.      TOTAL TIME TAKING CARE OF THIS PATIENT: 40 minutes.    Marrah Vanevery M.D on 09/29/2015 at 7:45 AM  Between 7am to 6pm - Pager - [phone] After 6pm go to [email or web address] - password [email or web address] [facility]  Office  [phone]  CC: Primary care physician; Lelon Huh, MD

## 2015-09-29 NOTE — Progress Notes (Signed)
Informed centra tele of d/c pt's telemetry. All forms completed for discharge. Pt verbalizes understanding. IV d/c . Explained to pt that all prescriptions were called tp his pharmacy. Instruction given on [employer]

## 2015-10-06 DIAGNOSIS — I10 Essential (primary) hypertension: Secondary | ICD-10-CM | POA: Insufficient documentation

## 2015-10-06 DIAGNOSIS — Z8547 Personal history of malignant neoplasm of testis: Secondary | ICD-10-CM | POA: Insufficient documentation

## 2015-10-13 ENCOUNTER — Inpatient Hospital Stay: Payer: Self-pay | Admitting: Family Medicine

## 2016-02-29 ENCOUNTER — Other Ambulatory Visit: Payer: Self-pay | Admitting: *Deleted

## 2016-02-29 MED ORDER — LISINOPRIL 20 MG PO TABS: 20.0000 mg | ORAL_TABLET | Freq: Every day | ORAL | 1 refills | Status: DC

## 2016-03-13 ENCOUNTER — Telehealth: Payer: Self-pay | Admitting: Family Medicine

## 2016-03-13 NOTE — Telephone Encounter (Signed)
error 

## 2016-04-03 ENCOUNTER — Ambulatory Visit: Payer: Self-pay | Admitting: Family Medicine

## 2016-04-12 ENCOUNTER — Ambulatory Visit (INDEPENDENT_AMBULATORY_CARE_PROVIDER_SITE_OTHER): Payer: BLUE CROSS/BLUE SHIELD | Admitting: Family Medicine

## 2016-04-12 ENCOUNTER — Encounter: Payer: Self-pay | Admitting: Family Medicine

## 2016-04-12 VITALS — BP 160/100 | HR 80 | Temp 98.3°F | Resp 16 | Wt 230.0 lb

## 2016-04-12 DIAGNOSIS — I10 Essential (primary) hypertension: Secondary | ICD-10-CM | POA: Diagnosis not present

## 2016-04-12 DIAGNOSIS — I2510 Atherosclerotic heart disease of native coronary artery without angina pectoris: Secondary | ICD-10-CM

## 2016-04-12 MED ORDER — ATORVASTATIN CALCIUM 40 MG PO TABS: 40.0000 mg | ORAL_TABLET | Freq: Every day | ORAL | 6 refills | Status: DC

## 2016-04-12 MED ORDER — LISINOPRIL-HYDROCHLOROTHIAZIDE 20-12.5 MG PO TABS: 1.0000 | ORAL_TABLET | Freq: Every day | ORAL | 1 refills | Status: DC

## 2016-04-12 MED ORDER — CLOPIDOGREL BISULFATE 75 MG PO TABS: 75.0000 mg | ORAL_TABLET | Freq: Every day | ORAL | 6 refills | Status: DC

## 2016-04-12 NOTE — Patient Instructions (Addendum)
   You may find that your muscles feel achy or weak after starting atorvastatin. If this happens, try taking OTC Coenzyme Q10, 200 units every day.

## 2016-04-12 NOTE — Progress Notes (Signed)
Patient: Bryan Shea Male    DOB: 07/20/1955   60 y.o.   MRN: [id number] Visit Date: 04/12/2016  Today's Provider: Lelon Huh, MD   Chief Complaint  Patient presents with  . Medication Refill   Subjective:    HPI     Hypertension, follow-up:    He was last seen for hypertension 03/13/2014.  BP at that visit was 146/88. Management since that visit includes; increased Lisinopril to 20 mg qd to improve Bp control.He reports good compliance with treatment. He is not having side effects. none He is exercising. He is not adherent to low salt diet.   Outside blood pressures are high. He is experiencing none.  Patient denies none.   Cardiovascular risk factors include none.  Use of agents associated with hypertension: none.   ----------------------------------------------------------------  Since last office visit, patient was admitted to hospital 09/2015 for chest pain and had mild increase of troponin. He was diagnosed with nonQ MI. Had cath with 50 % blockage of LAD and EF=40%. He was already on daily ASA prior to admission. Was prescribed Brillinta which he didn't fill due to cost. He was also prescribed atorvastatin 80 but he has not been taking for unclear reasons. Imdur an carvedilol were added which he is taking and tolerating well, and lisinopril 20mg  was continued unchanged. He states he has had no chest pains or dyspnea since discharge and generally feels well. Has not had follow up up with cardiology.  No Known Allergies   Current Outpatient Prescriptions:  .  acetaminophen (TYLENOL) 325 MG tablet, Take 650 mg by mouth every 6 (six) hours as needed., Disp: , Rfl:  .  aspirin EC 81 MG EC tablet, Take 1 tablet (81 mg total) by mouth daily., Disp: 30 tablet, Rfl: 3 .  carvedilol (COREG) 12.5 MG tablet, Take 1 tablet (12.5 mg total) by mouth 2 (two) times daily with a meal., Disp: 60 tablet, Rfl: 3 .  isosorbide mononitrate (IMDUR) 30 MG 24 hr tablet, Take  1 tablet (30 mg total) by mouth daily., Disp: 30 tablet, Rfl: 3 .  lisinopril (PRINIVIL,ZESTRIL) 20 MG tablet, Take 1 tablet (20 mg total) by mouth daily., Disp: 30 tablet, Rfl: 1 .  Multiple Vitamin (MULTIVITAMIN WITH MINERALS) TABS tablet, Take 1 tablet by mouth daily., Disp: , Rfl:  .  nitroGLYCERIN (NITROSTAT) 0.4 MG SL tablet, Place 1 tablet (0.4 mg total) under the tongue every 5 (five) minutes x 3 doses as needed for chest pain., Disp: 20 tablet, Rfl: 3  Review of Systems  Constitutional: Negative for appetite change, chills and fever.  Respiratory: Negative for chest tightness, shortness of breath and wheezing.   Cardiovascular: Negative for chest pain and palpitations.  Gastrointestinal: Negative for abdominal pain, nausea and vomiting.    Social History  Substance Use Topics  . Smoking status: Never Smoker  . Smokeless tobacco: Never Used  . Alcohol use Yes   Objective:   BP (!) 160/100 (BP Location: Left Arm, Patient Position: Sitting, Cuff Size: Large)   Pulse 80   Temp 98.3 F (36.8 C) (Oral)   Resp 16   Wt 230 lb (104.3 kg)   SpO2 98%   BMI 28.75 kg/m   Physical Exam   General Appearance:    Alert, cooperative, no distress  Eyes:    PERRL, conjunctiva/corneas clear, EOM's intact       Lungs:     Clear to auscultation bilaterally, respirations unlabored  Heart:  Regular rate and rhythm  Neurologic:   Awake, alert, oriented x 3. No apparent focal neurological           defect.      EKG: NSR.      Assessment & Plan:     1. Coronary artery disease involving native coronary artery of native heart without angina pectoris New problem. S/p NonQ MI. Asymptomatic since d/c. Counseled on benefits of statin therapy and sent in new rx to start atorvastatin. Since he was already on ASA during event and he cannot afford Brilinta that was prescribed, will add clopidogrel for the time being.  - EKG 12-Lead - atorvastatin (LIPITOR) 40 MG tablet; Take 1 tablet (40 mg total)  by mouth daily.  Dispense: 30 tablet; Refill: 6 - clopidogrel (PLAVIX) 75 MG tablet; Take 1 tablet (75 mg total) by mouth daily.  Dispense: 30 tablet; Refill: 6  2. Essential hypertension Uncontrolled. Change from lisinopril 20mg  daily to lisinopril-hydrochlorothiazide (ZESTORETIC) 20-12.5 MG tablet; Take 1 tablet by mouth daily.  Dispense: 30 tablet; Refill: 1  Patient Instructions   You may find that your muscles feel achy or weak after starting atorvastatin. If this happens, try taking OTC Coenzyme Q10, 200 units every day.   Return in about 1 month (around 05/13/2016).        Lelon Huh, MD  [facility]

## 2016-05-22 ENCOUNTER — Encounter: Payer: Self-pay | Admitting: Family Medicine

## 2016-05-22 ENCOUNTER — Ambulatory Visit (INDEPENDENT_AMBULATORY_CARE_PROVIDER_SITE_OTHER): Payer: BLUE CROSS/BLUE SHIELD | Admitting: Family Medicine

## 2016-05-22 VITALS — BP 144/82 | HR 94 | Temp 99.1°F | Resp 16 | Wt 224.0 lb

## 2016-05-22 DIAGNOSIS — I10 Essential (primary) hypertension: Secondary | ICD-10-CM

## 2016-05-22 DIAGNOSIS — I2510 Atherosclerotic heart disease of native coronary artery without angina pectoris: Secondary | ICD-10-CM

## 2016-05-22 NOTE — Progress Notes (Signed)
Patient: Bryan Shea Male    DOB: 1956/01/28   60 y.o.   MRN: [id number] Visit Date: 05/22/2016  Today's Provider: Lelon Huh, MD   Chief Complaint  Patient presents with  . Coronary Artery Disease    follow up  . Hypertension    follow up   Subjective:    HPI Follow up CAD: Patient was last seen on 04/12/2016 for this problem. Management during that visit includes starting Atorvastatin and adding Plavix. Patient was counseled to take OTC CoQ10 if he develops muscle aches after starting statin. Patient reports good complinace with treatment, and good tolerance.    Hypertension, follow-up:  BP Readings from Last 3 Encounters:  04/12/16 (!) 160/100  09/29/15 (!) 141/100    He was last seen for hypertension 1 months ago.  BP at that visit was 160/100. Management since that visit includes changing from Lisinopril 20mg  daily to Lisinopril- HCTZ 20-12.5mg  daily. He reports good compliance with treatment. He is not having side effects.  He is exercising. He is adherent to low salt diet.   Outside blood pressures are checked at pharmacy occasionally; patient unsure of the readings. He is experiencing none.  Patient denies chest pain, chest pressure/discomfort, claudication, dyspnea, exertional chest pressure/discomfort, fatigue, irregular heart beat, lower extremity edema, near-syncope, orthopnea, palpitations, paroxysmal nocturnal dyspnea, syncope and tachypnea.   Cardiovascular risk factors include advanced age (older than 65 for men, 75 for women) and hypertension.  Use of agents associated with hypertension: NSAIDS.     Weight trend: decreasing steadily Wt Readings from Last 3 Encounters:  04/12/16 230 lb (104.3 kg)  09/28/15 230 lb (104.3 kg)    Current diet: well balanced  ------------------------------------------------------------------------     No Known Allergies   Current Outpatient Prescriptions:  .  acetaminophen (TYLENOL) 325 MG  tablet, Take 650 mg by mouth every 6 (six) hours as needed., Disp: , Rfl:  .  aspirin EC 81 MG EC tablet, Take 1 tablet (81 mg total) by mouth daily., Disp: 30 tablet, Rfl: 3 .  atorvastatin (LIPITOR) 40 MG tablet, Take 1 tablet (40 mg total) by mouth daily., Disp: 30 tablet, Rfl: 6 .  carvedilol (COREG) 12.5 MG tablet, Take 1 tablet (12.5 mg total) by mouth 2 (two) times daily with a meal., Disp: 60 tablet, Rfl: 3 .  clopidogrel (PLAVIX) 75 MG tablet, Take 1 tablet (75 mg total) by mouth daily., Disp: 30 tablet, Rfl: 6 .  isosorbide mononitrate (IMDUR) 30 MG 24 hr tablet, Take 1 tablet (30 mg total) by mouth daily., Disp: 30 tablet, Rfl: 3 .  lisinopril-hydrochlorothiazide (ZESTORETIC) 20-12.5 MG tablet, Take 1 tablet by mouth daily., Disp: 30 tablet, Rfl: 1 .  Multiple Vitamin (MULTIVITAMIN WITH MINERALS) TABS tablet, Take 1 tablet by mouth daily., Disp: , Rfl:  .  nitroGLYCERIN (NITROSTAT) 0.4 MG SL tablet, Place 1 tablet (0.4 mg total) under the tongue every 5 (five) minutes x 3 doses as needed for chest pain., Disp: 20 tablet, Rfl: 3  Review of Systems  Constitutional: Negative for appetite change, chills and fever.  Respiratory: Negative for chest tightness, shortness of breath and wheezing.   Cardiovascular: Negative for chest pain and palpitations.  Gastrointestinal: Negative for abdominal pain, nausea and vomiting.  Endocrine: Negative for cold intolerance, heat intolerance, polydipsia, polyphagia and polyuria.    Social History  Substance Use Topics  . Smoking status: Never Smoker  . Smokeless tobacco: Never Used  . Alcohol use Yes  Comment: drinks once a month or less   Objective:   BP (!) 144/82 (BP Location: Left Arm, Patient Position: Sitting, Cuff Size: Large)   Pulse 94   Temp 99.1 F (37.3 C) (Oral)   Resp 16   Wt 224 lb (101.6 kg)   SpO2 98% Comment: room air  BMI 28.00 kg/m   Physical Exam    General Appearance:    Alert, cooperative, no distress  Eyes:     PERRL, conjunctiva/corneas clear, EOM's intact       Lungs:     Clear to auscultation bilaterally, respirations unlabored  Heart:    Regular rate and rhythm  Neurologic:   Awake, alert, oriented x 3. No apparent focal neurological           defect.           Assessment & Plan:     1. Coronary artery disease involving native coronary artery of native heart without angina pectoris Doing well with addition of atorvastatin, clopidogrel.  - Hepatic function panel - Comprehensive metabolic panel - Lipid panel - CBC  2. Essential hypertension Improved since change to lisinopril-hctz which he is tolerating well.  - Comprehensive metabolic panel - Lipid panel - CBC       Lelon Huh, MD  [facility]

## 2016-05-25 ENCOUNTER — Telehealth: Payer: Self-pay

## 2016-05-25 LAB — COMPREHENSIVE METABOLIC PANEL
ALT: 33 IU/L (ref 0–44)
AST: 20 IU/L (ref 0–40)
Albumin/Globulin Ratio: 1.8 (ref 1.2–2.2)
Albumin: 4.8 g/dL (ref 3.5–5.5)
Alkaline Phosphatase: 98 IU/L (ref 39–117)
BUN/Creatinine Ratio: 18 (ref 9–20)
BUN: 23 mg/dL (ref 6–24)
Bilirubin Total: 5 mg/dL — ABNORMAL HIGH (ref 0.0–1.2)
CO2: 23 mmol/L (ref 18–29)
Calcium: 9.3 mg/dL (ref 8.7–10.2)
Chloride: 93 mmol/L — ABNORMAL LOW (ref 96–106)
Creatinine, Ser: 1.25 mg/dL (ref 0.76–1.27)
GFR calc Af Amer: 72 mL/min/{1.73_m2} (ref >59)
GFR calc non Af Amer: 63 mL/min/{1.73_m2} (ref >59)
Globulin, Total: 2.6 g/dL (ref 1.5–4.5)
Glucose: 92 mg/dL (ref 65–99)
Potassium: 4.7 mmol/L (ref 3.5–5.2)
Sodium: 136 mmol/L (ref 134–144)
Total Protein: 7.4 g/dL (ref 6.0–8.5)

## 2016-05-25 LAB — CBC
Hematocrit: 42.7 % (ref 37.5–51.0)
Hemoglobin: 14.4 g/dL (ref 12.6–17.7)
MCH: 31.9 pg (ref 26.6–33.0)
MCHC: 33.7 g/dL (ref 31.5–35.7)
MCV: 95 fL (ref 79–97)
Platelets: 222 10*3/uL (ref 150–379)
RBC: 4.52 x10E6/uL (ref 4.14–5.80)
RDW: 12.4 % (ref 12.3–15.4)
WBC: 14 10*3/uL — ABNORMAL HIGH (ref 3.4–10.8)

## 2016-05-25 LAB — LIPID PANEL
Chol/HDL Ratio: 2.1 ratio units (ref 0.0–5.0)
Cholesterol, Total: 101 mg/dL (ref 100–199)
HDL: 48 mg/dL (ref >39)
LDL Calculated: 26 mg/dL (ref 0–99)
Triglycerides: 133 mg/dL (ref 0–149)
VLDL Cholesterol Cal: 27 mg/dL (ref 5–40)

## 2016-05-25 NOTE — Telephone Encounter (Signed)
Left message for patient to advise them and contacted [facility] to have additional labs ordered.KW

## 2016-05-25 NOTE — Telephone Encounter (Signed)
-----   Message from Birdie Sons, MD sent at 05/25/2016  8:06 AM EST ----- Bilirubin levels are elevated, please see if [facility] can add Direct and Indirect bilirubin levels.

## 2016-05-28 LAB — BILIRUBIN, FRACTIONATED(TOT/DIR/INDIR)
Bilirubin Total: 4.7 mg/dL — ABNORMAL HIGH (ref 0.0–1.2)
Bilirubin, Direct: 0.32 mg/dL (ref 0.00–0.40)
Bilirubin, Indirect: 4.38 mg/dL (ref 0.10–0.80)

## 2016-05-28 LAB — SPECIMEN STATUS REPORT

## 2016-05-29 ENCOUNTER — Telehealth: Payer: Self-pay

## 2016-05-29 DIAGNOSIS — R17 Unspecified jaundice: Secondary | ICD-10-CM

## 2016-05-29 NOTE — Telephone Encounter (Signed)
Done, and patient has been advised of results.

## 2016-05-29 NOTE — Telephone Encounter (Signed)
Left message to call back  

## 2016-05-29 NOTE — Telephone Encounter (Signed)
-----   Message from Birdie Sons, MD sent at 05/29/2016  7:59 AM EST ----- Patient has high levels of bilirubin. This is usually due to a benign liver condition called Gilbert's syndrome. But considering his history of hepatitis he should see gastroenterology to have this further evaluated. Please advise patient and order referral. Thanks.

## 2016-05-29 NOTE — Telephone Encounter (Signed)
Advised patient as below. Please refer patient to GI. Order has been placed. Thanks!

## 2016-06-20 ENCOUNTER — Ambulatory Visit (INDEPENDENT_AMBULATORY_CARE_PROVIDER_SITE_OTHER): Payer: BLUE CROSS/BLUE SHIELD | Admitting: Gastroenterology

## 2016-06-20 ENCOUNTER — Encounter: Payer: Self-pay | Admitting: Gastroenterology

## 2016-06-20 NOTE — Progress Notes (Signed)
Gastroenterology Consultation  Referring Provider:     Birdie Sons, MD Primary Care Physician:  Lelon Huh, MD Primary Gastroenterologist:  Dr. Jonathon Bellows  Reason for Consultation:     Elevated bilirubin         HPI:   Bryan Shea is a 60 y.o. y/o male referred for consultation & management  by Dr. Lelon Huh, MD.   He has been referred for an elevated bilirubin.  Incidentally found on recent labs which were performed as a routine set of labs. Fractionated levels suggest it is a predominantly indirect hyperbilirubinemia. He has completed hepatitis C treatment  With Sofusbivir and ribavirin in 2014 approximately. .  Denies any family history of liver disease.No new medications. Rarely consumes alcohol.Denies episodes of jaundice in the past . Denies any illegal drug use, no tatoos. No recent illness. No recent stressful situations. Uses tylenol as needed not often     Hepatic Function Latest Ref Rng & Units 05/24/2016 05/24/2016 03/13/2014  Total Protein 6.0 - 8.5 g/dL - 7.4 -  Albumin 3.5 - 5.5 g/dL - 4.8 -  AST 0 - 40 IU/L - 20 25  ALT 0 - 44 IU/L - 33 28  Alk Phosphatase 39 - 117 IU/L - 98 -  Total Bilirubin 0.0 - 1.2 mg/dL 4.7(H) 5.0(H) -  Bilirubin, Direct 0.00 - 0.40 mg/dL 0.32 - -      Past Medical History:  Diagnosis Date  . Hepatitis C virus   . History of chicken pox   . Hypertension   . Testicular cancer [facility])     Past Surgical History:  Procedure Laterality Date  . CARDIAC CATHETERIZATION Right 09/28/2015   Procedure: Left Heart Cath and Coronary Angiography;  Surgeon: Dionisio David, MD;  Location: [place] [facility];  Service: Cardiovascular;  Laterality: Right;  . LIVER BIOPSY  11/11/2009   Chronic Hepatitis. Graed 3. Early stage 2.5% steatosis. Focal ironidentified in Kupffer cells  . TESTICLE REMOVAL  1994   testicular mass excision    Prior to Admission medications   Medication Sig Start Date End Date Taking? Authorizing Provider    acetaminophen (TYLENOL) 325 MG tablet Take 650 mg by mouth every 6 (six) hours as needed.   Yes Historical Provider, MD  aspirin EC 81 MG EC tablet Take 1 tablet (81 mg total) by mouth daily. 09/29/15  Yes Fritzi Mandes, MD  atorvastatin (LIPITOR) 40 MG tablet Take 1 tablet (40 mg total) by mouth daily. 04/12/16  Yes Birdie Sons, MD  carvedilol (COREG) 12.5 MG tablet Take 1 tablet (12.5 mg total) by mouth 2 (two) times daily with a meal. 09/29/15  Yes Fritzi Mandes, MD  clopidogrel (PLAVIX) 75 MG tablet Take 1 tablet (75 mg total) by mouth daily. 04/12/16  Yes Birdie Sons, MD  isosorbide mononitrate (IMDUR) 30 MG 24 hr tablet Take 1 tablet (30 mg total) by mouth daily. 09/29/15  Yes Fritzi Mandes, MD  lisinopril-hydrochlorothiazide (ZESTORETIC) 20-12.5 MG tablet Take 1 tablet by mouth daily. 04/12/16  Yes Birdie Sons, MD  Multiple Vitamin (MULTIVITAMIN WITH MINERALS) TABS tablet Take 1 tablet by mouth daily.   Yes Historical Provider, MD  nitroGLYCERIN (NITROSTAT) 0.4 MG SL tablet Place 1 tablet (0.4 mg total) under the tongue every 5 (five) minutes x 3 doses as needed for chest pain. 09/29/15  Yes Fritzi Mandes, MD    Family History  Problem Relation Age of Onset  . Heart failure Father  deceased  . Breast cancer Mother   . Heart attack Neg Hx   . Diabetes Neg Hx      Social History  Substance Use Topics  . Smoking status: Never Smoker  . Smokeless tobacco: Never Used  . Alcohol use Yes     Comment: drinks once a month or less    Allergies as of 06/20/2016  . (No Known Allergies)    Review of Systems:    All systems reviewed and negative except where noted in HPI.   Physical Exam:  BP (!) 157/94   Pulse 90   Temp 98.2 F (36.8 C) (Oral)   Ht _0  (1.905 m)   Wt 222 lb 3.2 oz (100.8 kg)   BMI 27.77 kg/m  No LMP for male patient. Psych:  Alert and cooperative. Normal mood and affect. General:   Alert,  Well-developed, well-nourished, pleasant and cooperative in NAD Head:   Normocephalic and atraumatic. Eyes:  Sclera clear, no icterus.   Conjunctiva pink. Ears:  Normal auditory acuity. Nose:  No deformity, discharge, or lesions. Mouth:  No deformity or lesions,oropharynx pink & moist. Neck:  Supple; no masses or thyromegaly. Lungs:  Respirations even and unlabored.  Clear throughout to auscultation.   No wheezes, crackles, or rhonchi. No acute distress. Heart:  Regular rate and rhythm; no murmurs, clicks, rubs, or gallops. Abdomen:  Normal bowel sounds.  No bruits.  Soft, non-tender and non-distended without masses, hepatosplenomegaly or hernias noted.  No guarding or rebound tenderness.    Neurologic:  Alert and oriented x3;  grossly normal neurologically. Skin:  Intact without significant lesions or rashes. No jaundice. Lymph Nodes:  No significant cervical adenopathy. Psych:  Alert and cooperative. Normal mood and affect.  Imaging Studies: No results found.  Assessment and Plan:   Bryan Shea is a 60 y.o. y/o male has been referred for elevated indirect bilirubin   The causes of indirect hyperbilirubinemia are related to excess production of bilirubin from conditions such as hemolysis vs imapired metabolism from liver dysfunction or a combination. Other conditions such as medications, Gilberts disease are also part of the differential. I will perform labs to rule out hemolysis and also evaluate for co existing liver disease. I will obtain MRI/MRCP to rule out a liver mass, cirrhosis . If all GI tests are negative will refer to hematology as well.   Follow up in 4 weeks time  Dr Jonathon Bellows MD

## 2016-06-20 NOTE — Patient Instructions (Addendum)
You are scheduled for a MRCP of the liver on Wednesday, Jan 3rd at 2:00pm at [facility] outpatient imaging, [place] location. You cannot have anything to eat or drink after 10:00am day of scan.   If you need to reschedule this for any reason, please contact central scheduling at [phone].

## 2016-06-22 LAB — CBC WITH DIFFERENTIAL/PLATELET
Basophils Absolute: 0 10*3/uL (ref 0.0–0.2)
Basos: 0 %
EOS (ABSOLUTE): 0.1 10*3/uL (ref 0.0–0.4)
Eos: 1 %
Hematocrit: 44.3 % (ref 37.5–51.0)
Hemoglobin: 15 g/dL (ref 13.0–17.7)
Immature Grans (Abs): 0 10*3/uL (ref 0.0–0.1)
Immature Granulocytes: 0 %
Lymphocytes Absolute: 2.5 10*3/uL (ref 0.7–3.1)
Lymphs: 28 %
MCH: 31.7 pg (ref 26.6–33.0)
MCHC: 33.9 g/dL (ref 31.5–35.7)
MCV: 94 fL (ref 79–97)
Monocytes Absolute: 0.6 10*3/uL (ref 0.1–0.9)
Monocytes: 7 %
Neutrophils Absolute: 5.8 10*3/uL (ref 1.4–7.0)
Neutrophils: 64 %
Platelets: 240 10*3/uL (ref 150–379)
RBC: 4.73 x10E6/uL (ref 4.14–5.80)
RDW: 12.6 % (ref 12.3–15.4)
WBC: 9 10*3/uL (ref 3.4–10.8)

## 2016-06-22 LAB — HEPATITIS B SURFACE ANTIGEN: Hepatitis B Surface Ag: NEGATIVE

## 2016-06-22 LAB — TSH: TSH: 1.49 u[IU]/mL (ref 0.450–4.500)

## 2016-06-22 LAB — HEPATITIS A ANTIBODY, TOTAL: Hep A Total Ab: POSITIVE — AB

## 2016-06-22 LAB — FERRITIN: Ferritin: 303 ng/mL (ref 30–400)

## 2016-06-22 LAB — HEPATIC FUNCTION PANEL
ALT: 29 IU/L (ref 0–44)
AST: 22 IU/L (ref 0–40)
Albumin: 4.9 g/dL (ref 3.5–5.5)
Alkaline Phosphatase: 83 IU/L (ref 39–117)
Bilirubin Total: 2 mg/dL — ABNORMAL HIGH (ref 0.0–1.2)
Bilirubin, Direct: 0.42 mg/dL — ABNORMAL HIGH (ref 0.00–0.40)
Total Protein: 7.9 g/dL (ref 6.0–8.5)

## 2016-06-22 LAB — HEPATITIS B E ANTIGEN: Hep B E Ag: NEGATIVE

## 2016-06-22 LAB — HCV RNA QUANT: Hepatitis C Quantitation: NOT DETECTED IU/mL

## 2016-06-22 LAB — RETICULOCYTES: Retic Ct Pct: 1.5 % (ref 0.6–2.6)

## 2016-06-22 LAB — LACTATE DEHYDROGENASE: LDH: 204 IU/L (ref 121–224)

## 2016-06-22 LAB — CERULOPLASMIN: Ceruloplasmin: 32.3 mg/dL — ABNORMAL HIGH (ref 16.0–31.0)

## 2016-06-22 LAB — CELIAC DISEASE PANEL
Endomysial IgA: NEGATIVE
IgA/Immunoglobulin A, Serum: 259 mg/dL (ref 90–386)
Transglutaminase IgA: <2 U/mL (ref 0–3)

## 2016-06-22 LAB — IRON AND TIBC
Iron Saturation: 29 % (ref 15–55)
Iron: 107 ug/dL (ref 38–169)
Total Iron Binding Capacity: 364 ug/dL (ref 250–450)
UIBC: 257 ug/dL (ref 111–343)

## 2016-06-22 LAB — HEPATITIS B SURFACE ANTIBODY, QUANTITATIVE: Hepatitis B Surf Ab Quant: <3.1 m[IU]/mL — ABNORMAL LOW (ref >9.9)

## 2016-06-22 LAB — HEPATITIS B E ANTIBODY: Hep B E Ab: NEGATIVE

## 2016-06-22 LAB — ALPHA-1-ANTITRYPSIN: A-1 Antitrypsin: 137 mg/dL (ref 90–200)

## 2016-06-22 LAB — PROTIME-INR
INR: 1 (ref 0.8–1.2)
Prothrombin Time: 10.4 s (ref 9.1–12.0)

## 2016-06-22 LAB — DIRECT ANTIGLOBULIN TEST (NOT AT ARMC): Coombs', Direct: NEGATIVE

## 2016-06-22 LAB — HAPTOGLOBIN: Haptoglobin: 198 mg/dL (ref 34–200)

## 2016-06-22 LAB — HIV ANTIBODY (ROUTINE TESTING W REFLEX): HIV Screen 4th Generation wRfx: NONREACTIVE

## 2016-06-22 LAB — GAMMA GT: GGT: 82 IU/L — ABNORMAL HIGH (ref 0–65)

## 2016-06-26 ENCOUNTER — Other Ambulatory Visit: Payer: Self-pay | Admitting: Family Medicine

## 2016-06-26 DIAGNOSIS — I10 Essential (primary) hypertension: Secondary | ICD-10-CM

## 2016-06-27 ENCOUNTER — Other Ambulatory Visit: Payer: Self-pay | Admitting: Gastroenterology

## 2016-06-27 DIAGNOSIS — R17 Unspecified jaundice: Secondary | ICD-10-CM

## 2016-06-28 ENCOUNTER — Ambulatory Visit: Admission: RE | Admit: 2016-06-28 | Payer: MEDICAID | Source: Ambulatory Visit

## 2016-07-04 ENCOUNTER — Ambulatory Visit: Payer: BLUE CROSS/BLUE SHIELD | Admitting: Gastroenterology

## 2017-03-08 ENCOUNTER — Other Ambulatory Visit: Payer: Self-pay | Admitting: Family Medicine

## 2017-03-08 DIAGNOSIS — I10 Essential (primary) hypertension: Secondary | ICD-10-CM

## 2017-03-08 MED ORDER — LISINOPRIL-HYDROCHLOROTHIAZIDE 20-12.5 MG PO TABS: 1.0000 | ORAL_TABLET | Freq: Every day | ORAL | 5 refills | Status: DC

## 2017-03-08 NOTE — Telephone Encounter (Signed)
Pharmacy requesting refills. Thanks!  

## 2017-03-08 NOTE — Telephone Encounter (Signed)
He needs his lisinopril called into [facility] on [address]

## 2017-03-08 NOTE — Telephone Encounter (Signed)
Please review. Thanks!  

## 2017-03-26 ENCOUNTER — Other Ambulatory Visit: Payer: Self-pay | Admitting: Family Medicine

## 2017-03-26 DIAGNOSIS — I2510 Atherosclerotic heart disease of native coronary artery without angina pectoris: Secondary | ICD-10-CM

## 2017-06-04 ENCOUNTER — Ambulatory Visit: Payer: BLUE CROSS/BLUE SHIELD | Admitting: Gastroenterology

## 2017-11-06 ENCOUNTER — Other Ambulatory Visit: Payer: Self-pay | Admitting: *Deleted

## 2017-11-23 ENCOUNTER — Telehealth: Payer: Self-pay | Admitting: Family Medicine

## 2017-11-27 ENCOUNTER — Ambulatory Visit: Payer: BLUE CROSS/BLUE SHIELD | Admitting: Family Medicine

## 2017-11-30 ENCOUNTER — Ambulatory Visit: Payer: Self-pay | Admitting: Family Medicine

## 2018-03-18 ENCOUNTER — Other Ambulatory Visit: Payer: Self-pay | Admitting: Family Medicine

## 2018-03-18 DIAGNOSIS — I10 Essential (primary) hypertension: Secondary | ICD-10-CM

## 2018-04-01 ENCOUNTER — Other Ambulatory Visit: Payer: Self-pay | Admitting: Family Medicine

## 2018-04-01 DIAGNOSIS — I10 Essential (primary) hypertension: Secondary | ICD-10-CM

## 2018-04-01 NOTE — Telephone Encounter (Signed)
Left message advising pt to schedule an office visit.  Please schedule when he calls back in .    Thanks,   -Mickel Baas

## 2018-04-01 NOTE — Telephone Encounter (Signed)
This patient hasn't been seen for nearly 2 years  Please advise needs ov scheduled before refill can be approved.

## 2018-04-03 NOTE — Telephone Encounter (Signed)
LMTCB 04/03/2018  Thanks,   -Mickel Baas

## 2018-04-09 NOTE — Telephone Encounter (Signed)
Left message for pt to call back and schedule an appointment.    Thanks,   -Mickel Baas

## 2018-08-30 ENCOUNTER — Emergency Department: Payer: BLUE CROSS/BLUE SHIELD

## 2018-08-30 ENCOUNTER — Other Ambulatory Visit: Payer: Self-pay

## 2018-08-30 ENCOUNTER — Encounter: Payer: Self-pay | Admitting: Emergency Medicine

## 2018-08-30 ENCOUNTER — Inpatient Hospital Stay
Admit: 2018-08-30 | Discharge: 2018-08-30 | Disposition: A | Discharge status: Home / Self Care | Payer: BLUE CROSS/BLUE SHIELD | Attending: Internal Medicine | Admitting: Internal Medicine

## 2018-08-30 ENCOUNTER — Inpatient Hospital Stay: Admit: 2018-08-30 | Payer: BLUE CROSS/BLUE SHIELD

## 2018-08-30 ENCOUNTER — Inpatient Hospital Stay
Admission: EM | Admit: 2018-08-30 | Discharge: 2018-08-31 | DRG: 291 | Disposition: A | Discharge status: Home / Self Care | Payer: BLUE CROSS/BLUE SHIELD | Attending: Internal Medicine | Admitting: Internal Medicine

## 2018-08-30 DIAGNOSIS — T465X6A Underdosing of other antihypertensive drugs, initial encounter: Secondary | ICD-10-CM | POA: Diagnosis present

## 2018-08-30 DIAGNOSIS — I110 Hypertensive heart disease with heart failure: Principal | ICD-10-CM | POA: Diagnosis present

## 2018-08-30 DIAGNOSIS — Z8619 Personal history of other infectious and parasitic diseases: Secondary | ICD-10-CM | POA: Diagnosis not present

## 2018-08-30 DIAGNOSIS — E876 Hypokalemia: Secondary | ICD-10-CM | POA: Diagnosis not present

## 2018-08-30 DIAGNOSIS — I160 Hypertensive urgency: Secondary | ICD-10-CM | POA: Diagnosis present

## 2018-08-30 DIAGNOSIS — E785 Hyperlipidemia, unspecified: Secondary | ICD-10-CM | POA: Diagnosis not present

## 2018-08-30 DIAGNOSIS — Y92009 Unspecified place in unspecified non-institutional (private) residence as the place of occurrence of the external cause: Secondary | ICD-10-CM | POA: Diagnosis not present

## 2018-08-30 DIAGNOSIS — Z91128 Patient's intentional underdosing of medication regimen for other reason: Secondary | ICD-10-CM

## 2018-08-30 DIAGNOSIS — R7989 Other specified abnormal findings of blood chemistry: Secondary | ICD-10-CM | POA: Diagnosis not present

## 2018-08-30 DIAGNOSIS — Z7902 Long term (current) use of antithrombotics/antiplatelets: Secondary | ICD-10-CM

## 2018-08-30 DIAGNOSIS — Z803 Family history of malignant neoplasm of breast: Secondary | ICD-10-CM | POA: Diagnosis not present

## 2018-08-30 DIAGNOSIS — J9601 Acute respiratory failure with hypoxia: Secondary | ICD-10-CM | POA: Diagnosis present

## 2018-08-30 DIAGNOSIS — R778 Other specified abnormalities of plasma proteins: Secondary | ICD-10-CM

## 2018-08-30 DIAGNOSIS — I509 Heart failure, unspecified: Secondary | ICD-10-CM | POA: Diagnosis not present

## 2018-08-30 DIAGNOSIS — R0602 Shortness of breath: Secondary | ICD-10-CM | POA: Diagnosis not present

## 2018-08-30 DIAGNOSIS — Z7982 Long term (current) use of aspirin: Secondary | ICD-10-CM | POA: Diagnosis not present

## 2018-08-30 DIAGNOSIS — I5021 Acute systolic (congestive) heart failure: Secondary | ICD-10-CM | POA: Diagnosis not present

## 2018-08-30 DIAGNOSIS — J810 Acute pulmonary edema: Secondary | ICD-10-CM

## 2018-08-30 DIAGNOSIS — I10 Essential (primary) hypertension: Secondary | ICD-10-CM | POA: Diagnosis not present

## 2018-08-30 DIAGNOSIS — Z8249 Family history of ischemic heart disease and other diseases of the circulatory system: Secondary | ICD-10-CM

## 2018-08-30 DIAGNOSIS — I2510 Atherosclerotic heart disease of native coronary artery without angina pectoris: Secondary | ICD-10-CM | POA: Diagnosis not present

## 2018-08-30 DIAGNOSIS — Z79899 Other long term (current) drug therapy: Secondary | ICD-10-CM

## 2018-08-30 DIAGNOSIS — I248 Other forms of acute ischemic heart disease: Secondary | ICD-10-CM | POA: Diagnosis not present

## 2018-08-30 DIAGNOSIS — Z8547 Personal history of malignant neoplasm of testis: Secondary | ICD-10-CM | POA: Diagnosis not present

## 2018-08-30 DIAGNOSIS — R069 Unspecified abnormalities of breathing: Secondary | ICD-10-CM | POA: Diagnosis not present

## 2018-08-30 DIAGNOSIS — J811 Chronic pulmonary edema: Secondary | ICD-10-CM | POA: Diagnosis not present

## 2018-08-30 DIAGNOSIS — I5041 Acute combined systolic (congestive) and diastolic (congestive) heart failure: Secondary | ICD-10-CM | POA: Diagnosis present

## 2018-08-30 DIAGNOSIS — I5033 Acute on chronic diastolic (congestive) heart failure: Secondary | ICD-10-CM | POA: Diagnosis not present

## 2018-08-30 DIAGNOSIS — I169 Hypertensive crisis, unspecified: Secondary | ICD-10-CM | POA: Diagnosis not present

## 2018-08-30 LAB — URINE DRUG SCREEN, QUALITATIVE (ARMC ONLY)
Amphetamines, Ur Screen: NOT DETECTED
Barbiturates, Ur Screen: NOT DETECTED
Benzodiazepine, Ur Scrn: NOT DETECTED
Cannabinoid 50 Ng, Ur ~~LOC~~: NOT DETECTED
Cocaine Metabolite,Ur ~~LOC~~: NOT DETECTED
MDMA (Ecstasy)Ur Screen: NOT DETECTED
Methadone Scn, Ur: NOT DETECTED
Opiate, Ur Screen: NOT DETECTED
Phencyclidine (PCP) Ur S: NOT DETECTED
Tricyclic, Ur Screen: NOT DETECTED

## 2018-08-30 LAB — CBC WITH DIFFERENTIAL/PLATELET
Abs Immature Granulocytes: 0.03 10*3/uL (ref 0.00–0.07)
Basophils Absolute: 0 10*3/uL (ref 0.0–0.1)
Basophils Relative: 0 %
Eosinophils Absolute: 0.2 10*3/uL (ref 0.0–0.5)
Eosinophils Relative: 2 %
HCT: 50.1 % (ref 39.0–52.0)
Hemoglobin: 17 g/dL (ref 13.0–17.0)
Immature Granulocytes: 0 %
Lymphocytes Relative: 25 %
Lymphs Abs: 2.7 10*3/uL (ref 0.7–4.0)
MCH: 31.5 pg (ref 26.0–34.0)
MCHC: 33.9 g/dL (ref 30.0–36.0)
MCV: 92.8 fL (ref 80.0–100.0)
Monocytes Absolute: 0.7 10*3/uL (ref 0.1–1.0)
Monocytes Relative: 6 %
Neutro Abs: 7 10*3/uL (ref 1.7–7.7)
Neutrophils Relative %: 67 %
Platelets: 190 10*3/uL (ref 150–400)
RBC: 5.4 MIL/uL (ref 4.22–5.81)
RDW: 12.3 % (ref 11.5–15.5)
WBC: 10.6 10*3/uL — ABNORMAL HIGH (ref 4.0–10.5)
nRBC: 0 % (ref 0.0–0.2)

## 2018-08-30 LAB — COMPREHENSIVE METABOLIC PANEL
ALT: 50 U/L — ABNORMAL HIGH (ref 0–44)
AST: 52 U/L — ABNORMAL HIGH (ref 15–41)
Albumin: 4.4 g/dL (ref 3.5–5.0)
Alkaline Phosphatase: 61 U/L (ref 38–126)
Anion gap: 16 — ABNORMAL HIGH (ref 5–15)
BUN: 20 mg/dL (ref 8–23)
CO2: 20 mmol/L — ABNORMAL LOW (ref 22–32)
Calcium: 8.8 mg/dL — ABNORMAL LOW (ref 8.9–10.3)
Chloride: 104 mmol/L (ref 98–111)
Creatinine, Ser: 1.14 mg/dL (ref 0.61–1.24)
GFR calc Af Amer: >60 mL/min (ref >60)
GFR calc non Af Amer: >60 mL/min (ref >60)
Glucose, Bld: 189 mg/dL — ABNORMAL HIGH (ref 70–99)
Potassium: 3.4 mmol/L — ABNORMAL LOW (ref 3.5–5.1)
Sodium: 140 mmol/L (ref 135–145)
Total Bilirubin: 1.9 mg/dL — ABNORMAL HIGH (ref 0.3–1.2)
Total Protein: 8 g/dL (ref 6.5–8.1)

## 2018-08-30 LAB — TROPONIN I
Troponin I: 0.07 ng/mL (ref <0.03)
Troponin I: 0.08 ng/mL (ref <0.03)
Troponin I: 0.09 ng/mL (ref <0.03)
Troponin I: 0.1 ng/mL (ref <0.03)

## 2018-08-30 LAB — TSH: TSH: 1.043 u[IU]/mL (ref 0.350–4.500)

## 2018-08-30 LAB — GLUCOSE, CAPILLARY: Glucose-Capillary: 118 mg/dL — ABNORMAL HIGH (ref 70–99)

## 2018-08-30 LAB — MRSA PCR SCREENING: MRSA by PCR: NEGATIVE

## 2018-08-30 LAB — HEMOGLOBIN A1C
Hgb A1c MFr Bld: 5.6 % (ref 4.8–5.6)
Mean Plasma Glucose: 114.02 mg/dL

## 2018-08-30 LAB — ECHOCARDIOGRAM COMPLETE
Height: 75 in
Weight: 3830.71 oz

## 2018-08-30 LAB — APTT: aPTT: 24 seconds (ref 24–36)

## 2018-08-30 LAB — PROTIME-INR
INR: 1 (ref 0.8–1.2)
Prothrombin Time: 13 seconds (ref 11.4–15.2)

## 2018-08-30 LAB — HEPARIN LEVEL (UNFRACTIONATED)
Heparin Unfractionated: 0.53 IU/mL (ref 0.30–0.70)
Heparin Unfractionated: 0.44 IU/mL (ref 0.30–0.70)

## 2018-08-30 LAB — MAGNESIUM: Magnesium: 2.2 mg/dL (ref 1.7–2.4)

## 2018-08-30 LAB — BRAIN NATRIURETIC PEPTIDE: B Natriuretic Peptide: 365 pg/mL — ABNORMAL HIGH (ref 0.0–100.0)

## 2018-08-30 MED ORDER — FUROSEMIDE 10 MG/ML IJ SOLN
40.0000 mg | Freq: Every day | INTRAMUSCULAR | Status: DC
  Administered 2018-08-30: 40 mg via INTRAVENOUS
  Filled 2018-08-30: qty 4

## 2018-08-30 MED ORDER — ASPIRIN EC 81 MG PO TBEC
81.0000 mg | DELAYED_RELEASE_TABLET | Freq: Every day | ORAL | Status: DC
  Administered 2018-08-30 – 2018-08-31 (×2): 81 mg via ORAL
  Filled 2018-08-30 (×2): qty 1

## 2018-08-30 MED ORDER — POTASSIUM CHLORIDE CRYS ER 20 MEQ PO TBCR: 40.0000 meq | EXTENDED_RELEASE_TABLET | Freq: Once | ORAL | Status: DC

## 2018-08-30 MED ORDER — ACETAMINOPHEN 325 MG PO TABS: 650.0000 mg | ORAL_TABLET | Freq: Four times a day (QID) | ORAL | Status: DC | PRN

## 2018-08-30 MED ORDER — HYDROCHLOROTHIAZIDE 12.5 MG PO CAPS
12.5000 mg | ORAL_CAPSULE | Freq: Every day | ORAL | Status: DC
  Administered 2018-08-30: 12.5 mg via ORAL
  Filled 2018-08-30: qty 1

## 2018-08-30 MED ORDER — POTASSIUM CHLORIDE CRYS ER 20 MEQ PO TBCR
40.0000 meq | EXTENDED_RELEASE_TABLET | Freq: Every day | ORAL | Status: DC
  Administered 2018-08-30: 40 meq via ORAL
  Filled 2018-08-30: qty 2

## 2018-08-30 MED ORDER — ONDANSETRON HCL 4 MG PO TABS: 4.0000 mg | ORAL_TABLET | Freq: Four times a day (QID) | ORAL | Status: DC | PRN

## 2018-08-30 MED ORDER — ASPIRIN 81 MG PO CHEW: 324.0000 mg | CHEWABLE_TABLET | Freq: Once | ORAL | Status: DC

## 2018-08-30 MED ORDER — ORAL CARE MOUTH RINSE: 15.0000 mL | Freq: Two times a day (BID) | OROMUCOSAL | Status: DC

## 2018-08-30 MED ORDER — CLOPIDOGREL BISULFATE 75 MG PO TABS
75.0000 mg | ORAL_TABLET | Freq: Every day | ORAL | Status: DC
  Administered 2018-08-30 – 2018-08-31 (×2): 75 mg via ORAL
  Filled 2018-08-30 (×2): qty 1

## 2018-08-30 MED ORDER — ONDANSETRON HCL 4 MG/2ML IJ SOLN: 4.0000 mg | Freq: Four times a day (QID) | INTRAMUSCULAR | Status: DC | PRN

## 2018-08-30 MED ORDER — ATORVASTATIN CALCIUM 20 MG PO TABS
40.0000 mg | ORAL_TABLET | Freq: Every day | ORAL | Status: DC
  Administered 2018-08-30 – 2018-08-31 (×2): 40 mg via ORAL
  Filled 2018-08-30 (×2): qty 2

## 2018-08-30 MED ORDER — CHLORHEXIDINE GLUCONATE 0.12 % MT SOLN
15.0000 mL | Freq: Two times a day (BID) | OROMUCOSAL | Status: DC
  Administered 2018-08-30 – 2018-08-31 (×2): 15 mL via OROMUCOSAL
  Filled 2018-08-30 (×2): qty 15

## 2018-08-30 MED ORDER — CARVEDILOL 12.5 MG PO TABS
12.5000 mg | ORAL_TABLET | Freq: Two times a day (BID) | ORAL | Status: DC
  Administered 2018-08-30 – 2018-08-31 (×3): 12.5 mg via ORAL
  Filled 2018-08-30 (×3): qty 1

## 2018-08-30 MED ORDER — NITROGLYCERIN IN D5W 200-5 MCG/ML-% IV SOLN
0.0000 ug/min | INTRAVENOUS | Status: DC
  Administered 2018-08-30: 100 ug/min via INTRAVENOUS
  Administered 2018-08-30: 50 ug/min via INTRAVENOUS
  Administered 2018-08-30: 25 ug/min via INTRAVENOUS
  Administered 2018-08-30: 50 ug/min via INTRAVENOUS
  Administered 2018-08-30: 75 ug/min via INTRAVENOUS

## 2018-08-30 MED ORDER — ACETAMINOPHEN 650 MG RE SUPP: 650.0000 mg | Freq: Four times a day (QID) | RECTAL | Status: DC | PRN

## 2018-08-30 MED ORDER — NITROGLYCERIN 0.4 MG SL SUBL: 0.4000 mg | SUBLINGUAL_TABLET | SUBLINGUAL | Status: DC | PRN

## 2018-08-30 MED ORDER — NITROGLYCERIN IN D5W 200-5 MCG/ML-% IV SOLN
INTRAVENOUS | Status: AC
  Administered 2018-08-30: 50 ug/min via INTRAVENOUS
  Filled 2018-08-30: qty 250

## 2018-08-30 MED ORDER — LISINOPRIL-HYDROCHLOROTHIAZIDE 20-12.5 MG PO TABS: 1.0000 | ORAL_TABLET | Freq: Every day | ORAL | Status: DC

## 2018-08-30 MED ORDER — HEPARIN (PORCINE) 25000 UT/250ML-% IV SOLN
1400.0000 [IU]/h | INTRAVENOUS | Status: DC
  Administered 2018-08-30 (×2): 1400 [IU]/h via INTRAVENOUS
  Filled 2018-08-30 (×2): qty 250

## 2018-08-30 MED ORDER — DOCUSATE SODIUM 100 MG PO CAPS
100.0000 mg | ORAL_CAPSULE | Freq: Two times a day (BID) | ORAL | Status: DC
  Administered 2018-08-30 – 2018-08-31 (×3): 100 mg via ORAL
  Filled 2018-08-30 (×3): qty 1

## 2018-08-30 MED ORDER — POTASSIUM CHLORIDE CRYS ER 20 MEQ PO TBCR
40.0000 meq | EXTENDED_RELEASE_TABLET | ORAL | Status: AC
  Administered 2018-08-30: 40 meq via ORAL
  Filled 2018-08-30: qty 2

## 2018-08-30 MED ORDER — LISINOPRIL 20 MG PO TABS
20.0000 mg | ORAL_TABLET | Freq: Every day | ORAL | Status: DC
  Administered 2018-08-30 – 2018-08-31 (×2): 20 mg via ORAL
  Filled 2018-08-30 (×2): qty 1

## 2018-08-30 MED ORDER — HEPARIN SODIUM (PORCINE) 5000 UNIT/ML IJ SOLN
4000.0000 [IU] | Freq: Once | INTRAMUSCULAR | Status: AC
  Administered 2018-08-30: 4000 [IU] via INTRAVENOUS

## 2018-08-30 MED ORDER — ASPIRIN 81 MG PO CHEW
324.0000 mg | CHEWABLE_TABLET | Freq: Once | ORAL | Status: AC
  Administered 2018-08-30: 324 mg via ORAL
  Filled 2018-08-30: qty 4

## 2018-08-30 MED ORDER — FUROSEMIDE 10 MG/ML IJ SOLN
40.0000 mg | Freq: Once | INTRAMUSCULAR | Status: AC
  Administered 2018-08-30: 40 mg via INTRAVENOUS
  Filled 2018-08-30: qty 4

## 2018-08-30 NOTE — ED Notes (Signed)
Daughter at bedside.

## 2018-08-30 NOTE — Progress Notes (Signed)
ANTICOAGULATION CONSULT NOTE  Pharmacy Consult for heparin drip Indication: chest pain/ACS  Patient Measurements: Height: 6\' 3"  (190.5 cm) Weight: 240 lb (108.9 kg) IBW/kg (Calculated) : 84.5 Heparin Dosing Weight: 107 kg  Vital Signs: Temp: 97.7 F (36.5 C) (03/06 0410) Temp Source: Oral (03/06 0410) BP: 99/87 (03/06 0700) Pulse Rate: 79 (03/06 0700)  Labs: Recent Labs    08/30/18 0417  HGB 17.0  HCT 50.1  PLT 190  APTT 24  LABPROT 13.0  INR 1.0  CREATININE 1.14  TROPONINI 0.07*    Estimated Creatinine Clearance: 90.8 mL/min (by C-G formula based on SCr of 1.14 mg/dL).   Medical History: Past Medical History:  Diagnosis Date  . Hepatitis C virus   . History of chicken pox   . Hypertension   . Testicular cancer (HCC)     Medications:  No anticoag in PTA meds  Assessment: Trop 0.07, initial CBC wnl  Goal of Therapy:  Heparin level 0.3-0.7 units/ml Monitor platelets by anticoagulation protocol: Yes   Heparin Course: 3/6 AM initiation: 4000 unit bolus and initial rate of 1400 units/hr 3/6 1215 HL 0.53   Plan:  Continue heparin at 1400 units/hr. Follow-up heparin at 1800 to confirm  Dallie Piles, PharmD 08/30/2018,7:01 AM

## 2018-08-30 NOTE — ED Triage Notes (Signed)
Pt to room 24 via EMS from home; pt alert with audible rhales, diaphoretic; st awoke PTA with acute onset SHOB; pt denies any pain, no recent illness; pt denies hx of same; pt reports that he is feeling better upon arrival that initial symptoms; placed on card monitor; Dr Karma Greaser and RT to room

## 2018-08-30 NOTE — Progress Notes (Signed)
Transferred to 217 via wheelchair.

## 2018-08-30 NOTE — Progress Notes (Signed)
Patient ID: Bryan Shea, male   DOB: 1956-05-09, 63 y.o.   MRN: [id number]  [employer] PROGRESS NOTE  Bryan Shea LDJ:[id number] DOB: 06-02-1956 DOA: 08/30/2018 PCP: Birdie Sons, MD  HPI/Subjective: Patient feeling much better.  Breathing a lot better.  Off the BiPAP at this time and on regular nasal cannula.  Patient states that he had shortness of breath and chest pain when he came in.  He has not followed up after 2017.  Objective: Vitals:   08/30/18 1100 08/30/18 1200  BP: 109/77 114/83  Pulse: 78 79  Resp: 20 20  Temp:  98.4 F (36.9 C)  SpO2: 95% 99%    Filed Weights   08/30/18 0410 08/30/18 0800  Weight: 108.9 kg 108.6 kg    ROS: Review of Systems  Constitutional: Negative for chills and fever.  Eyes: Negative for blurred vision.  Respiratory: Positive for shortness of breath. Negative for cough.   Cardiovascular: Negative for chest pain.  Gastrointestinal: Negative for abdominal pain, constipation, diarrhea, nausea and vomiting.  Genitourinary: Negative for dysuria.  Musculoskeletal: Negative for joint pain.  Neurological: Negative for dizziness and headaches.   Exam: Physical Exam  Constitutional: He is oriented to person, place, and time.  HENT:  Nose: No mucosal edema.  Mouth/Throat: No oropharyngeal exudate or posterior oropharyngeal edema.  Eyes: Pupils are equal, round, and reactive to light. Conjunctivae, EOM and lids are normal.  Neck: No JVD present. Carotid bruit is not present. No edema present. No thyroid mass and no thyromegaly present.  Cardiovascular: S1 normal and S2 normal. Exam reveals no gallop.  No murmur heard. Pulses:      Dorsalis pedis pulses are 2+ on the right side and 2+ on the left side.  Respiratory: No respiratory distress. He has decreased breath sounds in the right lower field and the left lower field. He has no wheezes. He has no rhonchi. He has rales in the right lower field and the left lower field.  GI:  Soft. Bowel sounds are normal. There is no abdominal tenderness.  Musculoskeletal:     Right ankle: He exhibits no swelling.     Left ankle: He exhibits no swelling.  Lymphadenopathy:    He has no cervical adenopathy.  Neurological: He is alert and oriented to person, place, and time. No cranial nerve deficit.  Skin: Skin is warm. No rash noted. Nails show no clubbing.  Psychiatric: He has a normal mood and affect.      Data Reviewed: Basic Metabolic Panel: Recent Labs  Lab 08/30/18 0417  NA 140  K 3.4*  CL 104  CO2 20*  GLUCOSE 189*  BUN 20  CREATININE 1.14  CALCIUM 8.8*  MG 2.2   Liver Function Tests: Recent Labs  Lab 08/30/18 0417  AST 52*  ALT 50*  ALKPHOS 61  BILITOT 1.9*  PROT 8.0  ALBUMIN 4.4   CBC: Recent Labs  Lab 08/30/18 0417  WBC 10.6*  NEUTROABS 7.0  HGB 17.0  HCT 50.1  MCV 92.8  PLT 190   Cardiac Enzymes: Recent Labs  Lab 08/30/18 0417 08/30/18 0811  TROPONINI 0.07* 0.08*   BNP (last 3 results) Recent Labs    08/30/18 0417  BNP 365.0*     CBG: Recent Labs  Lab 08/30/18 0738  GLUCAP 118*    Recent Results (from the past 240 hour(s))  MRSA PCR Screening     Status: None   Collection Time: 08/30/18  7:46 AM  Result Value Ref  Range Status   MRSA by PCR NEGATIVE NEGATIVE Final    Comment:        The GeneXpert MRSA Assay (FDA approved for NASAL specimens only), is one component of a comprehensive MRSA colonization surveillance program. It is not intended to diagnose MRSA infection nor to guide or monitor treatment for MRSA infections. Performed at [facility], [address] [place], [place] [address]      Studies: Dg Chest Portable 1 View  Result Date: 08/30/2018 CLINICAL DATA:  Shortness of breath EXAM: PORTABLE CHEST 1 VIEW COMPARISON:  09/28/2015 FINDINGS: Diffuse interstitial opacity with Awanda Mink lines. Normal heart size and mediastinal contours. No effusion or pneumothorax. IMPRESSION: Generalized  interstitial opacity as seen with pulmonary edema or atypical infection. Electronically Signed   By: Monte Fantasia M.D.   On: 08/30/2018 04:26    Scheduled Meds: . aspirin EC  81 mg Oral Daily  . atorvastatin  40 mg Oral Daily  . carvedilol  12.5 mg Oral BID WC  . clopidogrel  75 mg Oral Daily  . docusate sodium  100 mg Oral BID  . furosemide  40 mg Intravenous Daily  . lisinopril  20 mg Oral Daily  . potassium chloride  40 mEq Oral Daily   Continuous Infusions: . heparin 1,400 Units/hr (08/30/18 0511)    Assessment/Plan:  1. Acute systolic congestive heart failure.  Lasix 40 mg IV daily, lisinopril and Coreg.  Repeat echocardiogram still pending. 2. Accelerated hypertension on presentation now blood pressure on the lower side.  Continue to monitor closely.  Get rid of hydrochlorothiazide since the patient is on Lasix. 3. Borderline elevated troponin likely demand ischemia from heart failure.  Patient currently on heparin drip. 4. Hypokalemia replace potassium orally 5. Elevated liver function test.  Continue to monitor 6. Hyperlipidemia unspecified on atorvastatin 7. History of CAD on Plavix, aspirin  Code Status:     Code Status Orders  (From admission, onward)         Start     Ordered   08/30/18 0743  Full code  Continuous     08/30/18 0743        Code Status History    Date Active Date Inactive Code Status Order ID Comments User Context   09/28/2015 0840 09/29/2015 1417 Full Code [id number]  Saundra Shelling, MD Inpatient      Disposition Plan: To be determined based on his respiratory status  Consultants:  Cardiology  Time spent: 35 minutes  [place]

## 2018-08-30 NOTE — Progress Notes (Addendum)
ANTICOAGULATION CONSULT NOTE - Initial Consult  Pharmacy Consult for heparin drip Indication: chest pain/ACS  No Known Allergies  Patient Measurements: Height: 6\' 3"  (190.5 cm) Weight: 240 lb (108.9 kg) IBW/kg (Calculated) : 84.5 Heparin Dosing Weight: 107 kg  Vital Signs: Temp: 97.7 F (36.5 C) (03/06 0410) Temp Source: Oral (03/06 0410) BP: 195/123 (03/06 0410) Pulse Rate: 109 (03/06 0410)  Labs: Recent Labs    08/30/18 0417  HGB 17.0  HCT 50.1  PLT 190    CrCl cannot be calculated (Patient's most recent lab result is older than the maximum 21 days allowed.).   Medical History: Past Medical History:  Diagnosis Date  . Hepatitis C virus   . History of chicken pox   . Hypertension   . Testicular cancer ([place])     Medications:  No anticoag in PTA meds.   Assessment: Trop 0.07  Goal of Therapy:  Heparin level 0.3-0.7 units/ml Monitor platelets by anticoagulation protocol: Yes   Plan:  4000 unit bolus and initial rate of 1400 units/hr. First heparin level 6 hours after start of infusion.  Londell Noll S 08/30/2018,4:58 AM

## 2018-08-30 NOTE — ED Notes (Signed)
Dr Marcille Blanco in to see pt; pt uprite on stretcher, bipap in place; pt denies c/o, st feeling much better; skin W&D

## 2018-08-30 NOTE — ED Notes (Signed)
Dr Karma Greaser at bedside to update daughter

## 2018-08-30 NOTE — Progress Notes (Signed)
ANTICOAGULATION CONSULT NOTE  Pharmacy Consult for heparin drip Indication: chest pain/ACS  Patient Measurements: Height: 6\' 3"  (190.5 cm) Weight: 239 lb 6.7 oz (108.6 kg) IBW/kg (Calculated) : 84.5 Heparin Dosing Weight: 107 kg  Vital Signs: Temp: 97.7 F (36.5 C) (03/06 1537) Temp Source: Oral (03/06 1537) BP: 125/85 (03/06 1721) Pulse Rate: 76 (03/06 1721)  Labs: Recent Labs    08/30/18 0417 08/30/18 0811 08/30/18 1215 08/30/18 1335 08/30/18 1822  HGB 17.0  --   --   --   --   HCT 50.1  --   --   --   --   PLT 190  --   --   --   --   APTT 24  --   --   --   --   LABPROT 13.0  --   --   --   --   INR 1.0  --   --   --   --   HEPARINUNFRC  --   --  0.53  --  0.44  CREATININE 1.14  --   --   --   --   TROPONINI 0.07* 0.08*  --  0.09*  --     Estimated Creatinine Clearance: 90.6 mL/min (by C-G formula based on SCr of 1.14 mg/dL).   Medical History: Past Medical History:  Diagnosis Date  . Hepatitis C virus   . History of chicken pox   . Hypertension   . Testicular cancer (HCC)     Medications:  No anticoag in PTA meds  Assessment: Trop 0.07, initial CBC wnl  Goal of Therapy:  Heparin level 0.3-0.7 units/ml Monitor platelets by anticoagulation protocol: Yes   Heparin Course: 3/6 AM initiation: 4000 unit bolus and initial rate of 1400 units/hr 3/6 1215 HL 0.53 3/6 1822 HL 0.44    Plan:  Continue heparin at 1400 units/hr. Follow-up heparin with AM labs.   Oswald Hillock, PharmD 08/30/2018,7:04 PM

## 2018-08-30 NOTE — ED Notes (Signed)
ED TO INPATIENT HANDOFF REPORT  ED Nurse Name and Phone #: Mosetta Pigeon [phone]  S Name/Age/Gender Bryan Shea 63 y.o. male Room/Bed: ED24A/ED24A  Code Status   Code Status: Prior  Home/SNF/Other Home Patient oriented to: self Is this baseline? Yes   Triage Complete: Triage complete  Chief Complaint shob  Triage Note Pt to room 24 via EMS from home; pt alert with audible rhales, diaphoretic; st awoke PTA with acute onset SHOB; pt denies any pain, no recent illness; pt denies hx of same; pt reports that he is feeling better upon arrival that initial symptoms; placed on card monitor; Dr Karma Greaser and RT to room   Allergies No Known Allergies  Level of Care/Admitting Diagnosis ED Disposition    ED Disposition Condition [place]: [place] [[id number]]  Level of Care: Stepdown [14]  Diagnosis: Acute respiratory failure with hypoxemia [facility]) [[id number]]  Admitting Physician: Harrie Foreman [[id number]]  Attending Physician: Harrie Foreman [[id number]]  Estimated length of stay: past midnight tomorrow  Certification:: I certify this patient will need inpatient services for at least 2 midnights  PT Class (Do Not Modify): Inpatient [101]  PT Acc Code (Do Not Modify): Private [1]       B Medical/Surgery History Past Medical History:  Diagnosis Date  . Hepatitis C virus   . History of chicken pox   . Hypertension   . Testicular cancer [facility])    Past Surgical History:  Procedure Laterality Date  . CARDIAC CATHETERIZATION Right 09/28/2015   Procedure: Left Heart Cath and Coronary Angiography;  Surgeon: Dionisio David, MD;  Location: [place] [facility];  Service: Cardiovascular;  Laterality: Right;  . LIVER BIOPSY  11/11/2009   Chronic Hepatitis. Graed 3. Early stage 2.5% steatosis. Focal ironidentified in Kupffer cells  . TESTICLE REMOVAL  1994   testicular mass excision     A IV Location/Drains/Wounds Patient  Lines/Drains/Airways Status   Active Line/Drains/Airways    Name:   Placement date:   Placement time:   Site:   Days:   Peripheral IV 08/30/18 Left Forearm   08/30/18    0414    Forearm   less than 1   Peripheral IV 08/30/18 Left Hand   08/30/18    0500    Hand   less than 1   Peripheral IV 08/30/18 Right Forearm   08/30/18    0501    Forearm   less than 1          Intake/Output Last 24 hours No intake or output data in the 24 hours ending 08/30/18 4481  Labs/Imaging Results for orders placed or performed during the hospital encounter of 08/30/18 (from the past 48 hour(s))  CBC with Differential     Status: Abnormal   Collection Time: 08/30/18  4:17 AM  Result Value Ref Range   WBC 10.6 (H) 4.0 - 10.5 K/uL   RBC 5.40 4.22 - 5.81 MIL/uL   Hemoglobin 17.0 13.0 - 17.0 g/dL   HCT 50.1 39.0 - 52.0 %   MCV 92.8 80.0 - 100.0 fL   MCH 31.5 26.0 - 34.0 pg   MCHC 33.9 30.0 - 36.0 g/dL   RDW 12.3 11.5 - 15.5 %   Platelets 190 150 - 400 K/uL   nRBC 0.0 0.0 - 0.2 %   Neutrophils Relative % 67 %   Neutro Abs 7.0 1.7 - 7.7 K/uL   Lymphocytes Relative 25 %   Lymphs  Abs 2.7 0.7 - 4.0 K/uL   Monocytes Relative 6 %   Monocytes Absolute 0.7 0.1 - 1.0 K/uL   Eosinophils Relative 2 %   Eosinophils Absolute 0.2 0.0 - 0.5 K/uL   Basophils Relative 0 %   Basophils Absolute 0.0 0.0 - 0.1 K/uL   Immature Granulocytes 0 %   Abs Immature Granulocytes 0.03 0.00 - 0.07 K/uL    Comment: Performed at [facility], [address] [place], [place] [address]  Comprehensive metabolic panel     Status: Abnormal   Collection Time: 08/30/18  4:17 AM  Result Value Ref Range   Sodium 140 135 - 145 mmol/L   Potassium 3.4 (L) 3.5 - 5.1 mmol/L   Chloride 104 98 - 111 mmol/L   CO2 20 (L) 22 - 32 mmol/L   Glucose, Bld 189 (H) 70 - 99 mg/dL   BUN 20 8 - 23 mg/dL   Creatinine, Ser 1.14 0.61 - 1.24 mg/dL   Calcium 8.8 (L) 8.9 - 10.3 mg/dL   Total Protein 8.0 6.5 - 8.1 g/dL   Albumin 4.4 3.5 - 5.0 g/dL    AST 52 (H) 15 - 41 U/L   ALT 50 (H) 0 - 44 U/L   Alkaline Phosphatase 61 38 - 126 U/L   Total Bilirubin 1.9 (H) 0.3 - 1.2 mg/dL   GFR calc non Af Amer >60 >60 mL/min   GFR calc Af Amer >60 >60 mL/min   Anion gap 16 (H) 5 - 15    Comment: Performed at [facility], [address] [place], [place] [address]  Troponin I - ONCE - STAT     Status: Abnormal   Collection Time: 08/30/18  4:17 AM  Result Value Ref Range   Troponin I 0.07 (HH) <0.03 ng/mL    Comment: CRITICAL RESULT CALLED TO, READ BACK BY AND VERIFIED WITH Shanen Norris AT 0500 08/30/2018 SMA Performed at [facility], [address] [place], [place] [address]   Brain natriuretic peptide     Status: Abnormal   Collection Time: 08/30/18  4:17 AM  Result Value Ref Range   B Natriuretic Peptide 365.0 (H) 0.0 - 100.0 pg/mL    Comment: Performed at [facility], [address] [place], [place] [address]  Magnesium     Status: None   Collection Time: 08/30/18  4:17 AM  Result Value Ref Range   Magnesium 2.2 1.7 - 2.4 mg/dL    Comment: Performed at [facility], [address] [place], [place] [address]   Dg Chest Portable 1 View  Result Date: 08/30/2018 CLINICAL DATA:  Shortness of breath EXAM: PORTABLE CHEST 1 VIEW COMPARISON:  09/28/2015 FINDINGS: Diffuse interstitial opacity with Awanda Mink lines. Normal heart size and mediastinal contours. No effusion or pneumothorax. IMPRESSION: Generalized interstitial opacity as seen with pulmonary edema or atypical infection. Electronically Signed   By: Monte Fantasia M.D.   On: 08/30/2018 04:26    Pending Labs Unresulted Labs (From admission, onward)    Start     Ordered   08/30/18 0454  Protime-INR  Add-on,   AD     08/30/18 0453   08/30/18 0454  APTT  Add-on,   AD     08/30/18 0453   08/30/18 0433  Urine Drug Screen, Qualitative ([place] only)  ONCE - STAT,   STAT     08/30/18 0432   Signed and Held  TSH  Add-on,   R     Signed and  Held    Signed and Held  Hemoglobin A1c  Add-on,   R     Signed and Held   Signed and Held  Troponin I - Now Then Q6H  Now then every 6 hours,   R     Signed and Held          Vitals/Pain Today's Vitals   08/30/18 0600 08/30/18 0605 08/30/18 0610 08/30/18 0615  BP: (!) 120/93 121/72 103/75 98/71  Pulse:   (!) 34 (!) 25  Resp: (!) 23 20 19 20   Temp:      TempSrc:      SpO2:   99% 98%  Weight:      Height:      PainSc:        Isolation Precautions No active isolations  Medications Medications  nitroGLYCERIN 50 mg in dextrose 5 % 250 mL (0.2 mg/mL) infusion (25 mcg/min Intravenous New Bag/Given 08/30/18 0500)  heparin ADULT infusion 100 units/mL (25000 units/264mL sodium chloride 0.45%) (1,400 Units/hr Intravenous New Bag/Given 08/30/18 0511)  potassium chloride SA (K-DUR,KLOR-CON) CR tablet 40 mEq (has no administration in time range)  aspirin chewable tablet 324 mg (324 mg Oral Given 08/30/18 0436)  furosemide (LASIX) injection 40 mg (40 mg Intravenous Given 08/30/18 0442)  heparin injection 4,000 Units (4,000 Units Intravenous Given 08/30/18 0512)    Mobility walks Low fall risk   Focused Assessments Pulmonary Assessment Handoff:  Lung sounds:   O2 Device: Bi-PAP        R Recommendations: See Admitting Provider Note  Report given to:   Additional Notes:

## 2018-08-30 NOTE — Progress Notes (Signed)
I spoke with Dr.Mayo in person about troponin level increased from 0.09 to 0.10. Patient sitting comfortable in bed with no acute distress. Patient denies shortness of breath, denies chest pain. New order to recheck troponin later this evening. Terrial Rhodes

## 2018-08-30 NOTE — ED Provider Notes (Signed)
[facility] Emergency Department Provider Note  ____________________________________________   First MD Initiated Contact with Patient 08/30/18 [phone]     (approximate)  I have reviewed the triage vital signs and the nursing notes.   HISTORY  Chief Complaint Shortness of Breath  Level 5 caveat:  history/ROS limited by acute/critical illness  HPI Bryan Shea is a 63 y.o. male with a history of coronary artery disease last catheterized in 2017 (see details below) not currently on any medications except for daily baby aspirin.  He presents by EMS for evaluation of acute onset severe shortness of breath.  He states that he had a normal day yesterday with no shortness of breath and no chest pain.  He went to bed feeling normal, but when he awoke he could not catch his breath and was gasping.  EMS was called and found him diaphoretic and severely short of breath and started him on a nonrebreather.  No room air SPO2 was recorded at that time.  He is feeling better upon arrival to the ED but still appears to be in severe respiratory distress with audible coarse, wet breath sounds and accessory muscle usage.  He denies chest pain, nausea, vomiting, and abdominal pain.  He states that this is never happened to him before and initially he denied ever having a cardiac work-up.  See hospital course for additional details.  He denies fever/chills and recent cough.  He states he is feeling better than when EMS arrived.         Past Medical History:  Diagnosis Date  . Hepatitis C virus   . History of chicken pox   . Hypertension   . Testicular cancer [facility])     Patient Active Problem List   Diagnosis Date Noted  . Acute respiratory failure with hypoxemia ([place]) 08/30/2018  . Essential hypertension 10/06/2015  . History of testicular cancer 10/06/2015  . CAD (coronary artery disease) 09/29/2015  . H/O adenomatous polyp of colon 03/03/2014  . History of hepatitis C  03/03/2014    Past Surgical History:  Procedure Laterality Date  . CARDIAC CATHETERIZATION Right 09/28/2015   Procedure: Left Heart Cath and Coronary Angiography;  Surgeon: Dionisio David, MD;  Location: [facility];  Service: Cardiovascular;  Laterality: Right;  . LIVER BIOPSY  11/11/2009   Chronic Hepatitis. [place] 3. Early stage 2.5% steatosis. Focal ironidentified in Kupffer cells  . TESTICLE REMOVAL  1994   testicular mass excision    Prior to Admission medications   Medication Sig Start Date End Date Taking? Authorizing Provider  aspirin EC 81 MG EC tablet Take 1 tablet (81 mg total) by mouth daily. 09/29/15  Yes Fritzi Mandes, MD  atorvastatin (LIPITOR) 40 MG tablet TAKE ONE TABLET BY MOUTH ONCE DAILY Patient not taking: Reported on 08/30/2018 03/26/17   Birdie Sons, MD  carvedilol (COREG) 12.5 MG tablet Take 1 tablet (12.5 mg total) by mouth 2 (two) times daily with a meal. Patient not taking: Reported on 08/30/2018 09/29/15   Fritzi Mandes, MD  clopidogrel (PLAVIX) 75 MG tablet TAKE ONE TABLET BY MOUTH ONCE DAILY Patient not taking: Reported on 08/30/2018 03/26/17   Birdie Sons, MD  isosorbide mononitrate (IMDUR) 30 MG 24 hr tablet Take 1 tablet (30 mg total) by mouth daily. Patient not taking: Reported on 08/30/2018 09/29/15   Fritzi Mandes, MD  lisinopril-hydrochlorothiazide ([place]) 20-12.5 MG tablet TAKE 1 TABLET BY MOUTH EVERY DAY Patient not taking: Reported on 08/30/2018 03/18/18  Birdie Sons, MD  nitroGLYCERIN (NITROSTAT) 0.4 MG SL tablet Place 1 tablet (0.4 mg total) under the tongue every 5 (five) minutes x 3 doses as needed for chest pain. Patient not taking: Reported on 08/30/2018 09/29/15   Fritzi Mandes, MD    Allergies Patient has no known allergies.  Family History  Problem Relation Age of Onset  . Heart failure Father        deceased  . Breast cancer Mother   . Heart attack Neg Hx   . Diabetes Neg Hx     Social History Social History    Tobacco Use  . Smoking status: Never Smoker  . Smokeless tobacco: Never Used  Substance Use Topics  . Alcohol use: Yes    Comment: drinks once a month or less  . Drug use: No    Review of Systems Level 5 caveat:  history/ROS limited by acute/critical illness   Severe shortness of breath, denies chest pain, nausea, and vomiting.  ____________________________________________   PHYSICAL EXAM:  VITAL SIGNS: ED Triage Vitals [08/30/18 0410]  Enc Vitals Group     BP (!) 195/123     Pulse Rate (!) 109     Resp (!) 22     Temp 97.7 F (36.5 C)     Temp Source Oral     SpO2 91 %     Weight 108.9 kg (240 lb)     Height 1.905 m (6\' 3" )     Head Circumference      Peak Flow      Pain Score 0     Pain Loc      Pain Edu?      Excl. in [place]?     Constitutional: Alert and oriented but ill-appearing and in severe respiratory distress. Eyes: Conjunctivae are normal.  Head: Atraumatic. Nose: No congestion/rhinnorhea. Mouth/Throat: Mucous membranes are moist. Neck: No stridor.  No meningeal signs.   Cardiovascular: Tachycardia, regular rhythm. Good peripheral circulation. Grossly normal heart sounds. Respiratory: Increased respiratory effort with accessory muscle usage and intercostal retractions.  Coarse, wet breath sounds are audible without the use of a stethoscope but confirmed with stethoscope auscultation. Gastrointestinal: Soft and nontender. No distention.  Musculoskeletal: No lower extremity tenderness nor edema. No gross deformities of extremities. Neurologic:  Normal speech and language. No gross focal neurologic deficits are appreciated.  Skin:  Skin is diaphoretic and clammy. Psychiatric: Mood and affect are normal. Speech and behavior are normal.  ____________________________________________   LABS (all labs ordered are listed, but only abnormal results are displayed)  Labs Reviewed  CBC WITH DIFFERENTIAL/PLATELET - Abnormal; Notable for the following  components:      Result Value   WBC 10.6 (*)    All other components within normal limits  COMPREHENSIVE METABOLIC PANEL - Abnormal; Notable for the following components:   Potassium 3.4 (*)    CO2 20 (*)    Glucose, Bld 189 (*)    Calcium 8.8 (*)    AST 52 (*)    ALT 50 (*)    Total Bilirubin 1.9 (*)    Anion gap 16 (*)    All other components within normal limits  TROPONIN I - Abnormal; Notable for the following components:   Troponin I 0.07 (*)    All other components within normal limits  BRAIN NATRIURETIC PEPTIDE - Abnormal; Notable for the following components:   B Natriuretic Peptide 365.0 (*)    All other components within normal limits  GLUCOSE, CAPILLARY -  Abnormal; Notable for the following components:   Glucose-Capillary 118 (*)    All other components within normal limits  MRSA PCR SCREENING  MAGNESIUM  PROTIME-INR  APTT  URINE DRUG SCREEN, QUALITATIVE ([facility] ONLY)  HEPARIN LEVEL (UNFRACTIONATED)  TSH  HEMOGLOBIN A1C  TROPONIN I  TROPONIN I  TROPONIN I   ____________________________________________  EKG  ED ECG REPORT I, Hinda Kehr, the attending physician, personally viewed and interpreted this ECG.  Date: 08/30/2018 EKG Time: 4:13 AM Rate: 104 Rhythm: Mild sinus tachycardia QRS Axis: normal Intervals: Frequent PVCs, LVH with repolarization abnormality ST/T Wave abnormalities: ST depression most notable in inferior leads consistent with ischemia Narrative Interpretation: no definitive evidence of acute ischemia; does not meet STEMI criteria.  ED ECG REPORT I, Hinda Kehr, the attending physician, personally viewed and interpreted this ECG.  Date: 08/30/2018 EKG Time: 4:24 AM Rate: 101 Rhythm: Mild sinus tachycardia with occasional PVC QRS Axis: normal Intervals: LVH with secondary repolarization abnormality ST/T Wave abnormalities: Mild ST depression consistent with ischemia but does not meet STEMI criteria Narrative Interpretation:  Probable demand ischemia; does not meet STEMI criteria.    ____________________________________________  RADIOLOGY I, Hinda Kehr, personally viewed and evaluated these images (plain radiographs) as part of my medical decision making, as well as reviewing the written report by the radiologist.  ED MD interpretation: Pulmonary edema  Official radiology report(s): Dg Chest Portable 1 View  Result Date: 08/30/2018 CLINICAL DATA:  Shortness of breath EXAM: PORTABLE CHEST 1 VIEW COMPARISON:  09/28/2015 FINDINGS: Diffuse interstitial opacity with Kerley lines. Normal heart size and mediastinal contours. No effusion or pneumothorax. IMPRESSION: Generalized interstitial opacity as seen with pulmonary edema or atypical infection. Electronically Signed   By: Monte Fantasia M.D.   On: 08/30/2018 04:26    ____________________________________________   PROCEDURES   Procedure(s) performed (including Critical Care):  .Critical Care Performed by: Hinda Kehr, MD Authorized by: Hinda Kehr, MD   Critical care provider statement:    Critical care time (minutes):  45   Critical care time was exclusive of:  Separately billable procedures and treating other patients   Critical care was necessary to treat or prevent imminent or life-threatening deterioration of the following conditions:  Respiratory failure   Critical care was time spent personally by me on the following activities:  Development of treatment plan with patient or surrogate, discussions with consultants, evaluation of patient's response to treatment, examination of patient, obtaining history from patient or surrogate, ordering and performing treatments and interventions, ordering and review of laboratory studies, ordering and review of radiographic studies, pulse oximetry, re-evaluation of patient's condition and review of old charts     ____________________________________________   [place] / MDM / [place] / ED COURSE  As part of my medical decision making, I reviewed the following data within the [place] notes reviewed and incorporated, Labs reviewed , EKG interpreted , Old EKG reviewed, Old chart reviewed, Discussed with admitting physician (Dr. Marcille Blanco) and Notes from prior ED visits        Differential diagnosis includes, but is not limited to, flash pulmonary edema secondary either to recent end STEMI or uncontrolled hypertension, ACS, less likely PE.  The patient has no history of obstructive lung disease.  He was in severe distress upon arrival and his presentation was most consistent with flash pulmonary edema.  I had him placed on BiPAP immediately and we started a nitroglycerin drip initially at 50 mcg/min which I rapidly titrated up  to 100 mcg/min due to his persistently elevated blood pressure which she says is very abnormal for him (he states his normal systolic blood pressure was around 130 and his initial pressure was about 093 systolic over about 267 systolic).  I gave the nurse verbal instructions to back down the nitroglycerin drip as needed to maintain a systolic blood pressure of around 110-120.  Lab work and chest x-ray are pending but I am treating empirically as described above.  I am concerned that he will have a least some degree of demand ischemia if not a significantly elevated troponin if in fact this issue was caused by a recent NSTEMI.  However the patient is adamant that he has not had chest pain.  Initially the patient said he had no history of any heart disease and is never had a catheterization.  However when I reviewed the medical record I see that Dr. Humphrey Rolls performed a catheterization 3 years ago which revealed a 50% LAD lesion that required aggressive medical management.  The patient reports that he has not been taking any of the medications prescribed over the last 2 to 3 years except for a daily baby aspirin due to financial  limitations.  He has not seen the cardiologist during that period of time either.  Either way the patient will require admission but I will frequently reassess as I titrate his nitroglycerin and see how he tolerates BiPAP.  His initial EKG shows what is likely demand ischemia and frequent PVCs.  Electrolytes are generally reassuring with no significant abnormalities to explain the patient's symptoms.  BNP is elevated at 365.  Troponin still pending.  Clinical Course as of Aug 29 844  Fri Aug 30, 2018  0435 Pulmonary edema   DG Chest Portable 1 View [CF]  1245 Patient's blood pressure is maintaining had right around 110/70.  We have adjusted the nitroglycerin several times and I have asked the nurse to turn it down to 50 mcg/min.  The patient is tolerated the BiPAP well.  He continues to say that he is not having any chest discomfort at all.  PVCs are still present on the monitor but less frequently than prior.Given the additional history of a known LAD blockage and the acute and severe onset of his symptoms, I am concerned that this could represent a cardiac event.  Troponin is not back yet but I am starting him on a heparin bolus and infusion as I think the benefits outweigh the risks.  He has also received a full dose aspirin.   [CF]  0502 demand ischemia vs NSTEMI  Troponin I(!!): 0.07 [CF]  0504 Paged Dr. Marcille Blanco with hospitalist service   [CF]    Clinical Course User Index [CF] Hinda Kehr, MD    ____________________________________________  FINAL CLINICAL IMPRESSION(S) / ED DIAGNOSES  Final diagnoses:  Acute on chronic congestive heart failure, unspecified heart failure type ([place])  Acute respiratory failure with hypoxemia ([place])  Acute pulmonary edema ([facility])  Elevated troponin I level     MEDICATIONS GIVEN DURING THIS VISIT:  Medications  nitroGLYCERIN 50 mg in dextrose 5 % 250 mL (0.2 mg/mL) infusion (25 mcg/min Intravenous Handoff 08/30/18 0752)  heparin ADULT infusion  100 units/mL (25000 units/276mL sodium chloride 0.45%) (1,400 Units/hr Intravenous Handoff 08/30/18 0753)  aspirin EC tablet 81 mg (has no administration in time range)  ED Discharge Orders    None       Note:  This document was prepared using Dragon voice recognition software and may include unintentional dictation errors.   Hinda Kehr, MD 08/30/18 [phone]

## 2018-08-30 NOTE — Consult Note (Signed)
Bryan Shea is a 63 y.o. male  [id number]  Primary Cardiologist: Dr. Neoma Laming Reason for Consultation: Hypertensive crisis, pulmonary edema  HPI: 63yo male with past medical history of HTN, CAD, testicular cancer, and hep C presents to ED via EMS due to shortness of breath. Denies chest pain. Chest xray showed pulmonary edema and was given IV lasix. Of note had cardiac cath 3 years ago with 50% mid LAD lesion and EF 35% on echo. Pt did not follow up with cardiology since cath in 2017.  Review of Systems: No chest pain, shortness of breath is much improved.    Past Medical History:  Diagnosis Date  . Hepatitis C virus   . History of chicken pox   . Hypertension   . Testicular cancer ([place])     Medications Prior to Admission  Medication Sig Dispense Refill  . aspirin EC 81 MG EC tablet Take 1 tablet (81 mg total) by mouth daily. 30 tablet 3  . atorvastatin (LIPITOR) 40 MG tablet TAKE ONE TABLET BY MOUTH ONCE DAILY (Patient not taking: Reported on 08/30/2018) 90 tablet 0  . carvedilol (COREG) 12.5 MG tablet Take 1 tablet (12.5 mg total) by mouth 2 (two) times daily with a meal. (Patient not taking: Reported on 08/30/2018) 60 tablet 3  . clopidogrel (PLAVIX) 75 MG tablet TAKE ONE TABLET BY MOUTH ONCE DAILY (Patient not taking: Reported on 08/30/2018) 90 tablet 0  . isosorbide mononitrate (IMDUR) 30 MG 24 hr tablet Take 1 tablet (30 mg total) by mouth daily. (Patient not taking: Reported on 08/30/2018) 30 tablet 3  . lisinopril-hydrochlorothiazide (PRINZIDE,ZESTORETIC) 20-12.5 MG tablet TAKE 1 TABLET BY MOUTH EVERY DAY (Patient not taking: Reported on 08/30/2018) 30 tablet 0  . nitroGLYCERIN (NITROSTAT) 0.4 MG SL tablet Place 1 tablet (0.4 mg total) under the tongue every 5 (five) minutes x 3 doses as needed for chest pain. (Patient not taking: Reported on 08/30/2018) 20 tablet 3     . aspirin EC  81 mg Oral Daily  . atorvastatin  40 mg Oral Daily  . carvedilol  12.5 mg Oral BID WC  .  clopidogrel  75 mg Oral Daily  . docusate sodium  100 mg Oral BID  . lisinopril  20 mg Oral Daily   And  . hydrochlorothiazide  12.5 mg Oral Daily    Infusions: . heparin 1,400 Units/hr (08/30/18 0511)  . nitroGLYCERIN 25 mcg/min (08/30/18 0500)    No Known Allergies  Social History   Socioeconomic History  . Marital status: Single    Spouse name: Not on file  . Number of children: Not on file  . Years of education: Not on file  . Highest education level: Not on file  Occupational History  . Occupation: Works for [employer]  . Financial resource strain: Not on file  . Food insecurity:    Worry: Not on file    Inability: Not on file  . Transportation needs:    Medical: Not on file    Non-medical: Not on file  Tobacco Use  . Smoking status: Never Smoker  . Smokeless tobacco: Never Used  Substance and Sexual Activity  . Alcohol use: Yes    Comment: drinks once a month or less  . Drug use: No  . Sexual activity: Not on file  Lifestyle  . Physical activity:    Days per week: Not on file    Minutes per session: Not on file  . Stress: Not  on file  Relationships  . Social connections:    Talks on phone: Not on file    Gets together: Not on file    Attends religious service: Not on file    Active member of club or organization: Not on file    Attends meetings of clubs or organizations: Not on file    Relationship status: Not on file  . Intimate partner violence:    Fear of current or ex partner: Not on file    Emotionally abused: Not on file    Physically abused: Not on file    Forced sexual activity: Not on file  Other Topics Concern  . Not on file  Social History Narrative  . Not on file    Family History  Problem Relation Age of Onset  . Heart failure Father        deceased  . Breast cancer Mother   . Heart attack Neg Hx   . Diabetes Neg Hx     PHYSICAL EXAM: Vitals:   08/30/18 0715 08/30/18 0800  BP: 123/84 (!) 138/93   Pulse: 86 86  Resp: 20 (!) 25  Temp: 97.7 F (36.5 C)   SpO2: 96% 98%     Intake/Output Summary (Last 24 hours) at 08/30/2018 0908 Last data filed at 08/30/2018 0800 Gross per 24 hour  Intake 117.04 ml  Output -  Net 117.04 ml    General:  Well appearing. No respiratory difficulty HEENT: normal Neck: supple. no JVD. Carotids 2+ bilat; no bruits. No lymphadenopathy or thryomegaly appreciated. Cor: PMI nondisplaced. Regular rate & rhythm. No rubs, gallops or murmurs. Lungs: clear Abdomen: soft, nontender, nondistended. No hepatosplenomegaly. No bruits or masses. Good bowel sounds. Extremities: no cyanosis, clubbing, rash, edema Neuro: alert & oriented x 3, cranial nerves grossly intact. moves all 4 extremities w/o difficulty. Affect pleasant.  ECG: NSR 89bpm frequent PVCs.  Results for orders placed or performed during the hospital encounter of 08/30/18 (from the past 24 hour(s))  CBC with Differential     Status: Abnormal   Collection Time: 08/30/18  4:17 AM  Result Value Ref Range   WBC 10.6 (H) 4.0 - 10.5 K/uL   RBC 5.40 4.22 - 5.81 MIL/uL   Hemoglobin 17.0 13.0 - 17.0 g/dL   HCT 50.1 39.0 - 52.0 %   MCV 92.8 80.0 - 100.0 fL   MCH 31.5 26.0 - 34.0 pg   MCHC 33.9 30.0 - 36.0 g/dL   RDW 12.3 11.5 - 15.5 %   Platelets 190 150 - 400 K/uL   nRBC 0.0 0.0 - 0.2 %   Neutrophils Relative % 67 %   Neutro Abs 7.0 1.7 - 7.7 K/uL   Lymphocytes Relative 25 %   Lymphs Abs 2.7 0.7 - 4.0 K/uL   Monocytes Relative 6 %   Monocytes Absolute 0.7 0.1 - 1.0 K/uL   Eosinophils Relative 2 %   Eosinophils Absolute 0.2 0.0 - 0.5 K/uL   Basophils Relative 0 %   Basophils Absolute 0.0 0.0 - 0.1 K/uL   Immature Granulocytes 0 %   Abs Immature Granulocytes 0.03 0.00 - 0.07 K/uL  Comprehensive metabolic panel     Status: Abnormal   Collection Time: 08/30/18  4:17 AM  Result Value Ref Range   Sodium 140 135 - 145 mmol/L   Potassium 3.4 (L) 3.5 - 5.1 mmol/L   Chloride 104 98 - 111 mmol/L    CO2 20 (L) 22 - 32 mmol/L   Glucose, Bld 189 (  H) 70 - 99 mg/dL   BUN 20 8 - 23 mg/dL   Creatinine, Ser 1.14 0.61 - 1.24 mg/dL   Calcium 8.8 (L) 8.9 - 10.3 mg/dL   Total Protein 8.0 6.5 - 8.1 g/dL   Albumin 4.4 3.5 - 5.0 g/dL   AST 52 (H) 15 - 41 U/L   ALT 50 (H) 0 - 44 U/L   Alkaline Phosphatase 61 38 - 126 U/L   Total Bilirubin 1.9 (H) 0.3 - 1.2 mg/dL   GFR calc non Af Amer >60 >60 mL/min   GFR calc Af Amer >60 >60 mL/min   Anion gap 16 (H) 5 - 15  Troponin I - ONCE - STAT     Status: Abnormal   Collection Time: 08/30/18  4:17 AM  Result Value Ref Range   Troponin I 0.07 (HH) <0.03 ng/mL  Brain natriuretic peptide     Status: Abnormal   Collection Time: 08/30/18  4:17 AM  Result Value Ref Range   B Natriuretic Peptide 365.0 (H) 0.0 - 100.0 pg/mL  Magnesium     Status: None   Collection Time: 08/30/18  4:17 AM  Result Value Ref Range   Magnesium 2.2 1.7 - 2.4 mg/dL  Protime-INR     Status: None   Collection Time: 08/30/18  4:17 AM  Result Value Ref Range   Prothrombin Time 13.0 11.4 - 15.2 seconds   INR 1.0 0.8 - 1.2  APTT     Status: None   Collection Time: 08/30/18  4:17 AM  Result Value Ref Range   aPTT 24 24 - 36 seconds  Glucose, capillary     Status: Abnormal   Collection Time: 08/30/18  7:38 AM  Result Value Ref Range   Glucose-Capillary 118 (H) 70 - 99 mg/dL   Dg Chest Portable 1 View  Result Date: 08/30/2018 CLINICAL DATA:  Shortness of breath EXAM: PORTABLE CHEST 1 VIEW COMPARISON:  09/28/2015 FINDINGS: Diffuse interstitial opacity with Kerley lines. Normal heart size and mediastinal contours. No effusion or pneumothorax. IMPRESSION: Generalized interstitial opacity as seen with pulmonary edema or atypical infection. Electronically Signed   By: Monte Fantasia M.D.   On: 08/30/2018 04:26     ASSESSMENT AND PLAN:  Acute on chronic CHF: History of EF 35% with no cardiac follow up since 2017.  Echo is ordered, continue IV lasix with potassium replacement as  ordered.  Elevated troponin: Mildly elevated troponin likely due to demand ischemia but had 50% mid LAD lesion in 2017, continue heparin drip for now while trending troponin. No cath planned at this time.   Blood pressure is improved, stop nitro drip. Continue coreg, lisinopril, HCTZ. Wean oxygen as tolerated, may transfer to step down.   Jake Bathe, NP-C Cell: [phone]

## 2018-08-30 NOTE — Progress Notes (Signed)
Pt declined Bipap. Stated he was breathing much better. Pt in on RA at this time with 97% Sats and no increased wob. Will continue to monitor.

## 2018-08-30 NOTE — H&P (Signed)
Bryan Shea is an 63 y.o. male.   Chief Complaint: Shortness of breath HPI: The patient with past medical history of hypertension, CAD, testicular cancer and hep C presents to the emergency department via EMS due to shortness of breath.  The patient was waken from sleep with difficulty breathing.  He denies chest pain.  He has never had shortness of breath like this before.  Notably the patient had an episode of chest pain 3 years ago which prompted heart catheterization.  He had been taking medication for his blood pressure but had stopped for more than 1 year.  Upon arrival to the emergency department the patient was on BiPAP.  Chest x-ray showed pulmonary edema.  He was given Lasix IV and has had good urine output which has improved his work of breathing.  Laboratory evaluation revealed elevated BNP as well as troponin.  Given his history of chest pain emergency department staff started a heparin drip prior to calling the hospitalist service for admission.  Past Medical History:  Diagnosis Date  . Hepatitis C virus   . History of chicken pox   . Hypertension   . Testicular cancer [facility])     Past Surgical History:  Procedure Laterality Date  . CARDIAC CATHETERIZATION Right 09/28/2015   Procedure: Left Heart Cath and Coronary Angiography;  Surgeon: Dionisio David, MD;  Location: [facility];  Service: Cardiovascular;  Laterality: Right;  . LIVER BIOPSY  11/11/2009   Chronic Hepatitis. [place] 3. Early stage 2.5% steatosis. Focal ironidentified in Kupffer cells  . TESTICLE REMOVAL  1994   testicular mass excision    Family History  Problem Relation Age of Onset  . Heart failure Father        deceased  . Breast cancer Mother   . Heart attack Neg Hx   . Diabetes Neg Hx    Social History:  reports that he has never smoked. He has never used smokeless tobacco. He reports current alcohol use. He reports that he does not use drugs.  Allergies: No Known Allergies  Prior to  Admission medications   Medication Sig Start Date End Date Taking? Authorizing Provider  aspirin EC 81 MG EC tablet Take 1 tablet (81 mg total) by mouth daily. 09/29/15  Yes Fritzi Mandes, MD  atorvastatin (LIPITOR) 40 MG tablet TAKE ONE TABLET BY MOUTH ONCE DAILY Patient not taking: Reported on 08/30/2018 03/26/17   Birdie Sons, MD  carvedilol (COREG) 12.5 MG tablet Take 1 tablet (12.5 mg total) by mouth 2 (two) times daily with a meal. Patient not taking: Reported on 08/30/2018 09/29/15   Fritzi Mandes, MD  clopidogrel (PLAVIX) 75 MG tablet TAKE ONE TABLET BY MOUTH ONCE DAILY Patient not taking: Reported on 08/30/2018 03/26/17   Birdie Sons, MD  isosorbide mononitrate (IMDUR) 30 MG 24 hr tablet Take 1 tablet (30 mg total) by mouth daily. Patient not taking: Reported on 08/30/2018 09/29/15   Fritzi Mandes, MD  lisinopril-hydrochlorothiazide (PRINZIDE,ZESTORETIC) 20-12.5 MG tablet TAKE 1 TABLET BY MOUTH EVERY DAY Patient not taking: Reported on 08/30/2018 03/18/18   Birdie Sons, MD  nitroGLYCERIN (NITROSTAT) 0.4 MG SL tablet Place 1 tablet (0.4 mg total) under the tongue every 5 (five) minutes x 3 doses as needed for chest pain. Patient not taking: Reported on 08/30/2018 09/29/15   Fritzi Mandes, MD     Results for orders placed or performed during the hospital encounter of 08/30/18 (from the past 48 hour(s))  CBC with Differential  Status: Abnormal   Collection Time: 08/30/18  4:17 AM  Result Value Ref Range   WBC 10.6 (H) 4.0 - 10.5 K/uL   RBC 5.40 4.22 - 5.81 MIL/uL   Hemoglobin 17.0 13.0 - 17.0 g/dL   HCT 50.1 39.0 - 52.0 %   MCV 92.8 80.0 - 100.0 fL   MCH 31.5 26.0 - 34.0 pg   MCHC 33.9 30.0 - 36.0 g/dL   RDW 12.3 11.5 - 15.5 %   Platelets 190 150 - 400 K/uL   nRBC 0.0 0.0 - 0.2 %   Neutrophils Relative % 67 %   Neutro Abs 7.0 1.7 - 7.7 K/uL   Lymphocytes Relative 25 %   Lymphs Abs 2.7 0.7 - 4.0 K/uL   Monocytes Relative 6 %   Monocytes Absolute 0.7 0.1 - 1.0 K/uL   Eosinophils Relative  2 %   Eosinophils Absolute 0.2 0.0 - 0.5 K/uL   Basophils Relative 0 %   Basophils Absolute 0.0 0.0 - 0.1 K/uL   Immature Granulocytes 0 %   Abs Immature Granulocytes 0.03 0.00 - 0.07 K/uL    Comment: Performed at [facility], [address] [place], [place] [address]  Comprehensive metabolic panel     Status: Abnormal   Collection Time: 08/30/18  4:17 AM  Result Value Ref Range   Sodium 140 135 - 145 mmol/L   Potassium 3.4 (L) 3.5 - 5.1 mmol/L   Chloride 104 98 - 111 mmol/L   CO2 20 (L) 22 - 32 mmol/L   Glucose, Bld 189 (H) 70 - 99 mg/dL   BUN 20 8 - 23 mg/dL   Creatinine, Ser 1.14 0.61 - 1.24 mg/dL   Calcium 8.8 (L) 8.9 - 10.3 mg/dL   Total Protein 8.0 6.5 - 8.1 g/dL   Albumin 4.4 3.5 - 5.0 g/dL   AST 52 (H) 15 - 41 U/L   ALT 50 (H) 0 - 44 U/L   Alkaline Phosphatase 61 38 - 126 U/L   Total Bilirubin 1.9 (H) 0.3 - 1.2 mg/dL   GFR calc non Af Amer >60 >60 mL/min   GFR calc Af Amer >60 >60 mL/min   Anion gap 16 (H) 5 - 15    Comment: Performed at [facility], [address] [place], [place] [address]  Troponin I - ONCE - STAT     Status: Abnormal   Collection Time: 08/30/18  4:17 AM  Result Value Ref Range   Troponin I 0.07 (HH) <0.03 ng/mL    Comment: CRITICAL RESULT CALLED TO, READ BACK BY AND VERIFIED WITH LISA THOMPSON AT 0500 08/30/2018 SMA Performed at [facility], [address] [place], [place] [address]   Brain natriuretic peptide     Status: Abnormal   Collection Time: 08/30/18  4:17 AM  Result Value Ref Range   B Natriuretic Peptide 365.0 (H) 0.0 - 100.0 pg/mL    Comment: Performed at [facility], [address] [place], [place] [address]  Magnesium     Status: None   Collection Time: 08/30/18  4:17 AM  Result Value Ref Range   Magnesium 2.2 1.7 - 2.4 mg/dL    Comment: Performed at [facility], [address] [place],  [address]   Dg Chest Portable 1 View  Result Date: 08/30/2018 CLINICAL  DATA:  Shortness of breath EXAM: PORTABLE CHEST 1 VIEW COMPARISON:  09/28/2015 FINDINGS: Diffuse interstitial opacity with Awanda Mink lines. Normal heart size and mediastinal contours. No effusion or  pneumothorax. IMPRESSION: Generalized interstitial opacity as seen with pulmonary edema or atypical infection. Electronically Signed   By: Monte Fantasia M.D.   On: 08/30/2018 04:26    Review of Systems  Constitutional: Negative for chills and fever.  HENT: Negative for sore throat and tinnitus.   Eyes: Negative for blurred vision and redness.  Respiratory: Positive for shortness of breath. Negative for cough.   Cardiovascular: Positive for orthopnea. Negative for chest pain, palpitations and PND.  Gastrointestinal: Negative for abdominal pain, diarrhea, nausea and vomiting.  Genitourinary: Negative for dysuria, frequency and urgency.  Musculoskeletal: Negative for joint pain and myalgias.  Skin: Negative for rash.       No lesions  Neurological: Negative for speech change, focal weakness and weakness.  Endo/Heme/Allergies: Does not bruise/bleed easily.       No temperature intolerance  Psychiatric/Behavioral: Negative for depression and suicidal ideas.    Blood pressure (!) 115/96, pulse (!) 30, temperature 97.7 F (36.5 C), temperature source Oral, resp. rate (!) 24, height 6\' 3"  (1.905 m), weight 108.9 kg, SpO2 94 %. Physical Exam  Vitals reviewed. Constitutional: He is oriented to person, place, and time. He appears well-developed and well-nourished. No distress.  HENT:  Head: Normocephalic and atraumatic.  Mouth/Throat: Oropharynx is clear and moist.  Eyes: Pupils are equal, round, and reactive to light. Conjunctivae and EOM are normal. No scleral icterus.  Neck: Normal range of motion. Neck supple. No JVD present. No tracheal deviation present. No thyromegaly present.  Cardiovascular: Normal rate and regular rhythm. Exam reveals no gallop and no friction rub.  No murmur  heard. Respiratory: Breath sounds normal. He is in respiratory distress. He has no wheezes. He has no rales.  On BiPAP  GI: Soft. Bowel sounds are normal. He exhibits no distension. There is no abdominal tenderness.  Genitourinary:    Genitourinary Comments: Deferred   Musculoskeletal: Normal range of motion.        General: No edema.  Lymphadenopathy:    He has no cervical adenopathy.  Neurological: He is alert and oriented to person, place, and time. No cranial nerve deficit.  Skin: Skin is warm and dry. No rash noted. No erythema.  Psychiatric: He has a normal mood and affect. His behavior is normal. Judgment and thought content normal.     Assessment/Plan This is a 63 year old male admitted for respiratory failure. 1.  Respiratory failure: Acute; with hypoxia.  Secondary to pulmonary edema.  The patient is on BiPAP.  Voiding well after Lasix given in the emergency department.  Wean respiratory support as tolerated.  BNP elevated.  Continue Lasix as needed.  Obtain echocardiogram. 2.  Hypertensive urgency: Improved with nitroglycerin drip.  The patient has not been taking his antihypertensive medications at home.  Resume lisinopril, hydrochlorothiazide and carvedilol 3.  Elevated troponin: Likely secondary to demand ischemia however continue to follow cardiac biomarkers.  Monitor telemetry.  Consult cardiology. 4.  CAD: Resume aspirin and Plavix.  The patient does not have chest pain at this time.  Heart catheterization in 2017 showed 50% LAD lesion.  Heparin drip started due to risk factors for ACS. 5.  Hyperlipidemia: Continue statin therapy 6.  DVT prophylaxis: Therapeutic anticoagulation 7.  GI prophylaxis: None The patient is a full code.  I have personally spent 45 minutes in critical care time with this patient.  Harrie Foreman, MD 08/30/2018, 6:09 AM

## 2018-08-30 NOTE — Progress Notes (Signed)
*  PRELIMINARY RESULTS* Echocardiogram 2D Echocardiogram has been performed.  Bryan Shea 08/30/2018, 2:59 PM

## 2018-08-30 NOTE — ED Notes (Signed)
RT at bedside to place bipap.

## 2018-08-31 LAB — TROPONIN I: Troponin I: 0.1 ng/mL (ref <0.03)

## 2018-08-31 LAB — HEPARIN LEVEL (UNFRACTIONATED): Heparin Unfractionated: 0.49 IU/mL (ref 0.30–0.70)

## 2018-08-31 LAB — BASIC METABOLIC PANEL
Anion gap: 9 (ref 5–15)
BUN: 31 mg/dL — ABNORMAL HIGH (ref 8–23)
CO2: 27 mmol/L (ref 22–32)
Calcium: 8.6 mg/dL — ABNORMAL LOW (ref 8.9–10.3)
Chloride: 103 mmol/L (ref 98–111)
Creatinine, Ser: 1.41 mg/dL — ABNORMAL HIGH (ref 0.61–1.24)
GFR calc Af Amer: >60 mL/min (ref >60)
GFR calc non Af Amer: 53 mL/min — ABNORMAL LOW (ref >60)
Glucose, Bld: 119 mg/dL — ABNORMAL HIGH (ref 70–99)
Potassium: 4.5 mmol/L (ref 3.5–5.1)
Sodium: 139 mmol/L (ref 135–145)

## 2018-08-31 MED ORDER — CLOPIDOGREL BISULFATE 75 MG PO TABS: 75.0000 mg | ORAL_TABLET | Freq: Every day | ORAL | 0 refills | Status: DC

## 2018-08-31 MED ORDER — LISINOPRIL 20 MG PO TABS: 20.0000 mg | ORAL_TABLET | Freq: Every day | ORAL | 0 refills | Status: DC

## 2018-08-31 MED ORDER — ATORVASTATIN CALCIUM 40 MG PO TABS: 40.0000 mg | ORAL_TABLET | Freq: Every day | ORAL | 0 refills | Status: DC

## 2018-08-31 MED ORDER — FUROSEMIDE 40 MG PO TABS: 40.0000 mg | ORAL_TABLET | Freq: Every day | ORAL | 0 refills | Status: DC

## 2018-08-31 MED ORDER — CARVEDILOL 12.5 MG PO TABS: 12.5000 mg | ORAL_TABLET | Freq: Two times a day (BID) | ORAL | 0 refills | Status: DC

## 2018-08-31 MED ORDER — POTASSIUM CHLORIDE CRYS ER 20 MEQ PO TBCR
20.0000 meq | EXTENDED_RELEASE_TABLET | Freq: Every day | ORAL | Status: DC
  Administered 2018-08-31: 20 meq via ORAL
  Filled 2018-08-31: qty 1

## 2018-08-31 MED ORDER — ASPIRIN 81 MG PO TBEC: 81.0000 mg | DELAYED_RELEASE_TABLET | Freq: Every day | ORAL | 0 refills | Status: AC

## 2018-08-31 MED ORDER — POTASSIUM CHLORIDE CRYS ER 20 MEQ PO TBCR: 20.0000 meq | EXTENDED_RELEASE_TABLET | Freq: Every day | ORAL | 0 refills | Status: DC

## 2018-08-31 MED ORDER — FUROSEMIDE 40 MG PO TABS
40.0000 mg | ORAL_TABLET | Freq: Every day | ORAL | Status: DC
  Administered 2018-08-31: 40 mg via ORAL
  Filled 2018-08-31: qty 1

## 2018-08-31 NOTE — Progress Notes (Signed)
Patient discharge teaching given, including activity, diet, follow-up appoints, and medications. Patient verbalized understanding of all discharge instructions. IV access was d/c'd. Vitals are stable. Skin is intact except as charted in most recent assessments. Pt to be escorted out by RN, to be driven home by family.  Dacota Devall  

## 2018-08-31 NOTE — Progress Notes (Signed)
SUBJECTIVE: Patient denies any chest pain or shortness of breath  Vitals:   08/30/18 1721 08/30/18 1943 08/31/18 0426 08/31/18 0500  BP: 125/85 125/88 138/89   Pulse: 76 82 67   Resp:  (!) 24 20   Temp:  98.6 F (37 C) 97.8 F (36.6 C)   TempSrc:  Oral Oral   SpO2: 96% 97% 98%   Weight:    111.3 kg  Height:        Intake/Output Summary (Last 24 hours) at 08/31/2018 0824 Last data filed at 08/31/2018 0402 Gross per 24 hour  Intake 225.87 ml  Output 900 ml  Net -674.13 ml    LABS: Basic Metabolic Panel: Recent Labs    08/30/18 0417 08/31/18 0555  NA 140 139  K 3.4* 4.5  CL 104 103  CO2 20* 27  GLUCOSE 189* 119*  BUN 20 31*  CREATININE 1.14 1.41*  CALCIUM 8.8* 8.6*  MG 2.2  --    Liver Function Tests: Recent Labs    08/30/18 0417  AST 52*  ALT 50*  ALKPHOS 61  BILITOT 1.9*  PROT 8.0  ALBUMIN 4.4   No results for input(s): LIPASE, AMYLASE in the last 72 hours. CBC: Recent Labs    08/30/18 0417  WBC 10.6*  NEUTROABS 7.0  HGB 17.0  HCT 50.1  MCV 92.8  PLT 190   Cardiac Enzymes: Recent Labs    08/30/18 1335 08/30/18 1822 08/31/18 0031  TROPONINI 0.09* 0.10* 0.10*   BNP: Invalid input(s): POCBNP D-Dimer: No results for input(s): DDIMER in the last 72 hours. Hemoglobin A1C: Recent Labs    08/30/18 0811  HGBA1C 5.6   Fasting Lipid Panel: No results for input(s): CHOL, HDL, LDLCALC, TRIG, CHOLHDL, LDLDIRECT in the last 72 hours. Thyroid Function Tests: Recent Labs    08/30/18 0811  TSH 1.043   Anemia Panel: No results for input(s): VITAMINB12, FOLATE, FERRITIN, TIBC, IRON, RETICCTPCT in the last 72 hours.   PHYSICAL EXAM General: Well developed, well nourished, in no acute distress HEENT:  Normocephalic and atramatic Neck:  No JVD.  Lungs: Clear bilaterally to auscultation and percussion. Heart: HRRR . Normal S1 and S2 without gallops or murmurs.  Abdomen: Bowel sounds are positive, abdomen soft and non-tender  Msk:  Back normal,  normal gait. Normal strength and tone for age. Extremities: No clubbing, cyanosis or edema.   Neuro: Alert and oriented X 3. Psych:  Good affect, responds appropriately  TELEMETRY: Sinus rhythm  ASSESSMENT AND PLAN: Congestive heart failure most likely due to diastolic dysfunction with wall motion abnormalities suggestive of coronary artery disease and mildly elevated troponin.  However patient denies any chest pain and shortness of breath.  Advise doing outpatient CCTA to rule out coronary artery disease.  Patient can be discharged today with follow-up in the office 1 PM on Monday.  Active Problems:   Acute respiratory failure with hypoxemia (HCC)    Bryan Shea A, MD, [facility] 08/31/2018 8:24 AM

## 2018-08-31 NOTE — Discharge Summary (Signed)
[facility] NAME: Bryan Shea    MR#:  [id number]  DATE OF BIRTH:  [birth year]  DATE OF ADMISSION:  08/30/2018 ADMITTING PHYSICIAN: Harrie Foreman, MD  DATE OF DISCHARGE: 08/31/2018  PRIMARY CARE PHYSICIAN: Birdie Sons, MD    ADMISSION DIAGNOSIS:  Acute pulmonary edema (HCC) [J81.0] Elevated troponin I level [R79.89] Acute respiratory failure with hypoxemia (HCC) [J96.01] Acute on chronic congestive heart failure, unspecified heart failure type ([place]) [I50.9]  DISCHARGE DIAGNOSIS:  Active Problems:   Acute respiratory failure with hypoxemia ([place])   SECONDARY DIAGNOSIS:   Past Medical History:  Diagnosis Date  . Hepatitis C virus   . History of chicken pox   . Hypertension   . Testicular cancer [facility])     HOSPITAL COURSE:   1.  Acute diastolic congestive heart failure.  Echocardiogram showed a normal EF.  The patient required BiPAP initially on presentation.  The patient was diuresed with Lasix 40 mg IV daily.  Started on lisinopril and Coreg.  Recommend checking a BMP on follow-up appointment. 2.  Accelerated hypertension on presentation.  Blood pressure much improved.  Continue Coreg, lisinopril and Lasix 3.  Borderline elevated troponin secondary to demand ischemia.  Discontinue heparin drip.  Continue aspirin Plavix and Coreg.  Cardiology planning on doing an outpatient cath. 4.  Hypokalemia.  Replace potassium with Lasix. 5.  Elevated liver function test.  Continue to monitor as outpatient. 6.  Hyperlipidemia unspecified on atorvastatin 7.  History of CAD on Plavix and aspirin 8.  Acute hypoxic respiratory failure on presentation.  Requiring BiPAP initially.  Now breathing comfortably on room air.  Stable for discharge home  DISCHARGE CONDITIONS:   Satisfactory  CONSULTS OBTAINED:  Treatment Team:  Dionisio David, MD  DRUG ALLERGIES:  No Known Allergies  DISCHARGE MEDICATIONS:   Allergies as of  08/31/2018   No Known Allergies     Medication List    STOP taking these medications   isosorbide mononitrate 30 MG 24 hr tablet Commonly known as:  IMDUR   lisinopril-hydrochlorothiazide 20-12.5 MG tablet Commonly known as:  PRINZIDE,ZESTORETIC   nitroGLYCERIN 0.4 MG SL tablet Commonly known as:  NITROSTAT     TAKE these medications   aspirin 81 MG EC tablet Take 1 tablet (81 mg total) by mouth daily.   atorvastatin 40 MG tablet Commonly known as:  LIPITOR Take 1 tablet (40 mg total) by mouth daily.   carvedilol 12.5 MG tablet Commonly known as:  COREG Take 1 tablet (12.5 mg total) by mouth 2 (two) times daily with a meal.   clopidogrel 75 MG tablet Commonly known as:  PLAVIX Take 1 tablet (75 mg total) by mouth daily.   furosemide 40 MG tablet Commonly known as:  LASIX Take 1 tablet (40 mg total) by mouth daily.   lisinopril 20 MG tablet Commonly known as:  PRINIVIL,ZESTRIL Take 1 tablet (20 mg total) by mouth daily.   potassium chloride SA 20 MEQ tablet Commonly known as:  K-DUR,KLOR-CON Take 1 tablet (20 mEq total) by mouth daily.        DISCHARGE INSTRUCTIONS:   Follow-up with Dr. Ceasar Mons this week Follow-up PMD 5 days   If you experience worsening of your admission symptoms, develop shortness of breath, life threatening emergency, suicidal or homicidal thoughts you must seek medical attention immediately by calling 911 or calling your MD immediately  if symptoms less severe.  You Must read complete instructions/literature along  with all the possible adverse reactions/side effects for all the Medicines you take and that have been prescribed to you. Take any new Medicines after you have completely understood and accept all the possible adverse reactions/side effects.   Please note  You were cared for by a hospitalist during your hospital stay. If you have any questions about your discharge medications or the care you received while you were in the  hospital after you are discharged, you can call the unit and asked to speak with the hospitalist on call if the hospitalist that took care of you is not available. Once you are discharged, your primary care physician will handle any further medical issues. Please note that NO REFILLS for any discharge medications will be authorized once you are discharged, as it is imperative that you return to your primary care physician (or establish a relationship with a primary care physician if you do not have one) for your aftercare needs so that they can reassess your need for medications and monitor your lab values.    Today   CHIEF COMPLAINT:   Chief Complaint  Patient presents with  . Shortness of Breath    HISTORY OF PRESENT ILLNESS:  Bryan Shea  is a 63 y.o. male presented with shortness of breath   VITAL SIGNS:  Blood pressure (!) 147/82, pulse 67, temperature 97.8 F (36.6 C), temperature source Oral, resp. rate 20, height 6\' 3"  (1.905 m), weight 111.3 kg, SpO2 98 %.   PHYSICAL EXAMINATION:  GENERAL:  63 y.o.-year-old patient lying in the bed with no acute distress.  EYES: Pupils equal, round, reactive to light and accommodation. No scleral icterus. Extraocular muscles intact.  HEENT: Head atraumatic, normocephalic. Oropharynx and nasopharynx clear.  NECK:  Supple, no jugular venous distention. No thyroid enlargement, no tenderness.  LUNGS: Normal breath sounds bilaterally, no wheezing, rales,rhonchi or crepitation. No use of accessory muscles of respiration.  CARDIOVASCULAR: S1, S2 normal. No murmurs, rubs, or gallops.  ABDOMEN: Soft, non-tender, non-distended. Bowel sounds present. No organomegaly or mass.  EXTREMITIES: No pedal edema, cyanosis, or clubbing.  NEUROLOGIC: Cranial nerves II through XII are intact. Muscle strength 5/5 in all extremities. Sensation intact. Gait not checked.  PSYCHIATRIC: The patient is alert and oriented x 3.  SKIN: No obvious rash, lesion, or  ulcer.   DATA REVIEW:   CBC Recent Labs  Lab 08/30/18 0417  WBC 10.6*  HGB 17.0  HCT 50.1  PLT 190    Chemistries  Recent Labs  Lab 08/30/18 0417 08/31/18 0555  NA 140 139  K 3.4* 4.5  CL 104 103  CO2 20* 27  GLUCOSE 189* 119*  BUN 20 31*  CREATININE 1.14 1.41*  CALCIUM 8.8* 8.6*  MG 2.2  --   AST 52*  --   ALT 50*  --   ALKPHOS 61  --   BILITOT 1.9*  --     Cardiac Enzymes Recent Labs  Lab 08/31/18 0031  TROPONINI 0.10*    Microbiology Results  Results for orders placed or performed during the hospital encounter of 08/30/18  MRSA PCR Screening     Status: None   Collection Time: 08/30/18  7:46 AM  Result Value Ref Range Status   MRSA by PCR NEGATIVE NEGATIVE Final    Comment:        The GeneXpert MRSA Assay (FDA approved for NASAL specimens only), is one component of a comprehensive MRSA colonization surveillance program. It is not intended to diagnose MRSA infection nor  to guide or monitor treatment for MRSA infections. Performed at [facility], [address] [place], [place] [address]     RADIOLOGY:  Dg Chest Portable 1 View  Result Date: 08/30/2018 CLINICAL DATA:  Shortness of breath EXAM: PORTABLE CHEST 1 VIEW COMPARISON:  09/28/2015 FINDINGS: Diffuse interstitial opacity with Awanda Mink lines. Normal heart size and mediastinal contours. No effusion or pneumothorax. IMPRESSION: Generalized interstitial opacity as seen with pulmonary edema or atypical infection. Electronically Signed   By: Monte Fantasia M.D.   On: 08/30/2018 04:26      Management plans discussed with the patient, family and they are in agreement.  CODE STATUS:     Code Status Orders  (From admission, onward)         Start     Ordered   08/30/18 0743  Full code  Continuous     08/30/18 0743        Code Status History    Date Active Date Inactive Code Status Order ID Comments User Context   09/28/2015 0840 09/29/2015 1417 Full Code [id number]  Saundra Shelling, MD Inpatient      TOTAL TIME TAKING CARE OF THIS PATIENT: 35 minutes.    Loletha Grayer M.D on 08/31/2018 at 1:14 PM  Between 7am to 6pm - Pager - [phone]  After 6pm go to [email or web address] - password [email or web address]  [employer] Office  [phone]  CC: Primary care physician; Birdie Sons, MD

## 2018-08-31 NOTE — Progress Notes (Signed)
Patient ID: Bryan Shea    Patient was admitted to [facility] on 08/30/2018.  Discharged home on 08/31/2018.  Please excuse from work through 09/01/2018.  May return to work on 09/02/2018.  Will need follow-up appointments as outpatient.  Dr Loletha Grayer [phone]

## 2018-08-31 NOTE — Progress Notes (Signed)
ANTICOAGULATION CONSULT NOTE  Pharmacy Consult for heparin drip Indication: chest pain/ACS  Patient Measurements: Height: 6\' 3"  (190.5 cm) Weight: 245 lb 6 oz (111.3 kg) IBW/kg (Calculated) : 84.5 Heparin Dosing Weight: 107 kg  Vital Signs: Temp: 97.8 F (36.6 C) (03/07 0426) Temp Source: Oral (03/07 0426) BP: 138/89 (03/07 0426) Pulse Rate: 67 (03/07 0426)  Labs: Recent Labs    08/30/18 0417  08/30/18 1215 08/30/18 1335 08/30/18 1822 08/31/18 0031 08/31/18 0555  HGB 17.0  --   --   --   --   --   --   HCT 50.1  --   --   --   --   --   --   PLT 190  --   --   --   --   --   --   APTT 24  --   --   --   --   --   --   LABPROT 13.0  --   --   --   --   --   --   INR 1.0  --   --   --   --   --   --   HEPARINUNFRC  --   --  0.53  --  0.44  --  0.49  CREATININE 1.14  --   --   --   --   --  1.41*  TROPONINI 0.07*   < >  --  0.09* 0.10* 0.10*  --    < > = values in this interval not displayed.    Estimated Creatinine Clearance: 74.1 mL/min (A) (by C-G formula based on SCr of 1.41 mg/dL (H)).   Medical History: Past Medical History:  Diagnosis Date  . Hepatitis C virus   . History of chicken pox   . Hypertension   . Testicular cancer (HCC)     Medications:  No anticoag in PTA meds  Assessment: Trop 0.07, initial CBC wnl  Goal of Therapy:  Heparin level 0.3-0.7 units/ml Monitor platelets by anticoagulation protocol: Yes   Heparin Course: 3/6 AM initiation: 4000 unit bolus and initial rate of 1400 units/hr 3/6 1215 HL 0.53 3/6 1822 HL 0.44    Plan:  Continue heparin at 1400 units/hr. Follow-up heparin with AM labs.   3/7 AM heparin level 0.49. Continue current regimen. Recheck heparin level and CBC with tomorrow AM labs.  Romey Cohea S, PharmD 08/31/2018,7:04 AM

## 2018-09-02 ENCOUNTER — Telehealth: Payer: Self-pay

## 2018-09-02 NOTE — Telephone Encounter (Signed)
I have made the 1st attempt to contact the patient or family member in charge, in order to follow up from recently being discharged from the hospital. I left a message on voicemail requesting a CB. -MM 

## 2018-09-02 NOTE — Telephone Encounter (Signed)
HFU is not scheduled. 

## 2018-09-03 DIAGNOSIS — I110 Hypertensive heart disease with heart failure: Secondary | ICD-10-CM | POA: Diagnosis not present

## 2018-09-03 DIAGNOSIS — I509 Heart failure, unspecified: Secondary | ICD-10-CM | POA: Diagnosis not present

## 2018-09-03 DIAGNOSIS — I10 Essential (primary) hypertension: Secondary | ICD-10-CM | POA: Diagnosis not present

## 2018-09-03 DIAGNOSIS — R0602 Shortness of breath: Secondary | ICD-10-CM | POA: Diagnosis not present

## 2018-09-03 NOTE — Telephone Encounter (Signed)
Pt returned McKenzie's call  teri

## 2018-09-03 NOTE — Telephone Encounter (Signed)
Pt returned call ° °teri °

## 2018-09-03 NOTE — Telephone Encounter (Signed)
I have made the 2nd attempt to contact the patient or family member in charge, in order to follow up from recently being discharged from the hospital. I left a message on voicemail requesting a CB. -MM  

## 2018-09-04 NOTE — Telephone Encounter (Signed)
Last attempt - MM 

## 2018-09-05 NOTE — Telephone Encounter (Signed)
Tried to contact pt to schedule his HFU apt. LM requesting a CB. Pt can schedule apt with phone team when he SB. -MM

## 2018-09-05 NOTE — Telephone Encounter (Signed)
He needs to be seen within the next week

## 2018-09-05 NOTE — Telephone Encounter (Signed)
Transition Care Management Follow-up Telephone Call  Date of discharge and from where: [facility] on 08/31/18.  How have you been since you were released from the hospital? Doing better, no SOB. B/p has been better since starting new B/p meds. Declines any other s/s.  Any questions or concerns? No   Items Reviewed:  Did the pt receive and understand the discharge instructions provided? Yes   Medications obtained and verified? Yes   Any new allergies since your discharge? No   Dietary orders reviewed? N/A  Do you have support at home? Yes   Other (ie: DME, Home Health, etc) N/A  Functional Questionnaire: (I = Independent and D = Dependent)  Bathing/Dressing- I   Meal Prep- I  Eating- I  Maintaining continence- I  Transferring/Ambulation- I  Managing Meds- I   Follow up appointments reviewed:    PCP Hospital f/u appt confirmed? Pt is wanting to wait until April to f/u. Need the ok from PCP .   [facility] f/u appt confirmed? Yes    Are transportation arrangements needed? No   If their condition worsens, is the pt aware to call  their PCP or go to the ED? Yes  Was the patient provided with contact information for the PCP's office or ED? Yes  Was the pt encouraged to call back with questions or concerns? Yes

## 2018-09-09 NOTE — Telephone Encounter (Signed)
Scheduled HFU on 09/18/18.

## 2018-09-10 ENCOUNTER — Ambulatory Visit: Payer: Self-pay | Admitting: Family

## 2018-09-16 DIAGNOSIS — R079 Chest pain, unspecified: Secondary | ICD-10-CM | POA: Diagnosis not present

## 2018-09-18 ENCOUNTER — Encounter: Payer: Self-pay | Admitting: Family Medicine

## 2018-09-18 ENCOUNTER — Other Ambulatory Visit: Payer: Self-pay

## 2018-09-18 ENCOUNTER — Ambulatory Visit (INDEPENDENT_AMBULATORY_CARE_PROVIDER_SITE_OTHER): Payer: BLUE CROSS/BLUE SHIELD | Admitting: Family Medicine

## 2018-09-18 VITALS — BP 137/86 | HR 80 | Temp 98.8°F | Resp 18 | Wt 245.0 lb

## 2018-09-18 DIAGNOSIS — I519 Heart disease, unspecified: Secondary | ICD-10-CM

## 2018-09-18 DIAGNOSIS — I10 Essential (primary) hypertension: Secondary | ICD-10-CM

## 2018-09-18 NOTE — Progress Notes (Signed)
Patient: Bryan Shea Male    DOB: 1955-08-30   63 y.o.   MRN: [id number] Visit Date: 09/18/2018  Today's Provider: Lelon Huh, MD   Chief Complaint  Patient presents with  . Hospitalization Follow-up   Subjective:     HPI  Follow up Hospitalization  Patient was admitted to [facility] on 08/30/2018 and discharged on 08/31/2018. He was treated for acute respiratory failure with hypoxemia and CHF. Patient was diuresed with Lasix, and stated on Lisinopril and Coreg. Per discharge summary, patient was to follow up with Cardiology and have BMP rechecked as outpatient. Telephone follow up was done on 09/02/2018 He reports good compliance with treatment. He reports this condition is stable. Patient has an appointment to follow up with Dr. Humphrey Rolls tomorrow at 3:30pm to review labs and recent stress test results. He has not having chest pains, or dyspnea.  ------------------------------------------------------------------------------------    No Known Allergies   Current Outpatient Medications:  .  aspirin 81 MG EC tablet, Take 1 tablet (81 mg total) by mouth daily., Disp: 30 tablet, Rfl: 0 .  atorvastatin (LIPITOR) 40 MG tablet, Take 1 tablet (40 mg total) by mouth daily., Disp: 30 tablet, Rfl: 0 .  carvedilol (COREG) 12.5 MG tablet, Take 1 tablet (12.5 mg total) by mouth 2 (two) times daily with a meal. (Patient taking differently: Take 25 mg by mouth daily. ), Disp: 60 tablet, Rfl: 0 .  clopidogrel (PLAVIX) 75 MG tablet, Take 1 tablet (75 mg total) by mouth daily., Disp: 30 tablet, Rfl: 0 .  Coenzyme Q10 300 MG CAPS, Take 1 capsule by mouth daily., Disp: , Rfl:  .  furosemide (LASIX) 40 MG tablet, Take 1 tablet (40 mg total) by mouth daily., Disp: 30 tablet, Rfl: 0 .  lisinopril (PRINIVIL,ZESTRIL) 20 MG tablet, Take 1 tablet (20 mg total) by mouth daily., Disp: 30 tablet, Rfl: 0 .  Omega-3 Fatty Acids (FISH OIL) 1000 MG CAPS, Take 1 capsule by mouth daily., Disp: , Rfl:  .   potassium chloride SA (K-DUR,KLOR-CON) 20 MEQ tablet, Take 1 tablet (20 mEq total) by mouth daily., Disp: 30 tablet, Rfl: 0 .  hydrALAZINE (APRESOLINE) 25 MG tablet, Take 25 mg by mouth 2 (two) times daily., Disp: , Rfl:   Review of Systems  Constitutional: Negative for appetite change, chills and fever.  Respiratory: Negative for chest tightness, shortness of breath and wheezing.   Cardiovascular: Negative for chest pain and palpitations.  Gastrointestinal: Negative for abdominal pain, nausea and vomiting.    Social History   Tobacco Use  . Smoking status: Never Smoker  . Smokeless tobacco: Never Used  Substance Use Topics  . Alcohol use: Yes    Comment: drinks once a month or less      Objective:   BP 137/86 (BP Location: Right Arm, Patient Position: Sitting, Cuff Size: Large)   Pulse 80   Temp 98.8 F (37.1 C) (Oral)   Resp 18   Wt 245 lb (111.1 kg)   SpO2 95% Comment: room air  BMI 30.62 kg/m  Vitals:   09/18/18 1513  BP: 137/86  Pulse: 80  Resp: 18  Temp: 98.8 F (37.1 C)  TempSrc: Oral  SpO2: 95%  Weight: 245 lb (111.1 kg)     Physical Exam   General Appearance:    Alert, cooperative, no distress  Eyes:    PERRL, conjunctiva/corneas clear, EOM's intact       Lungs:     Clear to  auscultation bilaterally, respirations unlabored  Heart:    Regular rate and rhythm  Neurologic:   Awake, alert, oriented x 3. No apparent focal neurological           defect.           Assessment & Plan    1. Essential hypertension Much better on current medications.  - Renal function panel  2. Impaired left ventricular relaxation Likely secondary to chronic hypertension. Much better on diuretics and BP medications. Follow up with cardiology tomorrow as scheduled.      Lelon Huh, MD  [facility]

## 2018-09-18 NOTE — Patient Instructions (Signed)
.   Please review the attached list of medications and notify my office if there are any errors.   . Please bring all of your medications to every appointment so we can make sure that our medication list is the same as yours.   

## 2018-09-19 DIAGNOSIS — R079 Chest pain, unspecified: Secondary | ICD-10-CM | POA: Diagnosis not present

## 2018-09-19 DIAGNOSIS — I10 Essential (primary) hypertension: Secondary | ICD-10-CM | POA: Diagnosis not present

## 2018-09-19 DIAGNOSIS — I5021 Acute systolic (congestive) heart failure: Secondary | ICD-10-CM | POA: Diagnosis not present

## 2018-09-19 LAB — RENAL FUNCTION PANEL
Albumin: 4.8 g/dL (ref 3.8–4.8)
BUN/Creatinine Ratio: 26 — ABNORMAL HIGH (ref 10–24)
BUN: 34 mg/dL — ABNORMAL HIGH (ref 8–27)
CO2: 20 mmol/L (ref 20–29)
Calcium: 9.6 mg/dL (ref 8.6–10.2)
Chloride: 101 mmol/L (ref 96–106)
Creatinine, Ser: 1.3 mg/dL — ABNORMAL HIGH (ref 0.76–1.27)
GFR calc Af Amer: 68 mL/min/{1.73_m2} (ref >59)
GFR calc non Af Amer: 59 mL/min/{1.73_m2} — ABNORMAL LOW (ref >59)
Glucose: 101 mg/dL — ABNORMAL HIGH (ref 65–99)
Phosphorus: 4.6 mg/dL — ABNORMAL HIGH (ref 2.8–4.1)
Potassium: 4.4 mmol/L (ref 3.5–5.2)
Sodium: 139 mmol/L (ref 134–144)

## 2018-09-21 DIAGNOSIS — I519 Heart disease, unspecified: Secondary | ICD-10-CM | POA: Insufficient documentation

## 2018-09-23 ENCOUNTER — Telehealth: Payer: Self-pay

## 2018-09-23 NOTE — Telephone Encounter (Signed)
Patient advised as below.  

## 2018-09-23 NOTE — Telephone Encounter (Signed)
-----   Message from Birdie Sons, MD sent at 09/19/2018  7:32 AM EDT ----- Kidney functions improved since hospital discharge. Normal potassium and sodium levels. Continue current medications.  Please send copy to his cardiologist Dr. Humphrey Rolls, patient has follow up with him this afternoon.

## 2018-09-23 NOTE — Telephone Encounter (Signed)
Lmtcb, lab results faxed to Dr. Alanda Slim

## 2018-09-27 DIAGNOSIS — R072 Precordial pain: Secondary | ICD-10-CM | POA: Diagnosis not present

## 2018-10-03 DIAGNOSIS — E782 Mixed hyperlipidemia: Secondary | ICD-10-CM | POA: Diagnosis not present

## 2018-10-03 DIAGNOSIS — I509 Heart failure, unspecified: Secondary | ICD-10-CM | POA: Diagnosis not present

## 2018-10-03 DIAGNOSIS — I10 Essential (primary) hypertension: Secondary | ICD-10-CM | POA: Diagnosis not present

## 2018-10-03 DIAGNOSIS — R0602 Shortness of breath: Secondary | ICD-10-CM | POA: Diagnosis not present

## 2018-11-14 DIAGNOSIS — E782 Mixed hyperlipidemia: Secondary | ICD-10-CM | POA: Diagnosis not present

## 2018-11-14 DIAGNOSIS — I10 Essential (primary) hypertension: Secondary | ICD-10-CM | POA: Diagnosis not present

## 2018-11-14 DIAGNOSIS — I509 Heart failure, unspecified: Secondary | ICD-10-CM | POA: Diagnosis not present

## 2018-11-14 DIAGNOSIS — I2510 Atherosclerotic heart disease of native coronary artery without angina pectoris: Secondary | ICD-10-CM | POA: Diagnosis not present

## 2018-12-11 ENCOUNTER — Encounter: Payer: Self-pay | Admitting: Family Medicine

## 2018-12-11 DIAGNOSIS — I517 Cardiomegaly: Secondary | ICD-10-CM | POA: Insufficient documentation

## 2019-01-06 DIAGNOSIS — I2510 Atherosclerotic heart disease of native coronary artery without angina pectoris: Secondary | ICD-10-CM | POA: Diagnosis not present

## 2019-01-06 DIAGNOSIS — I5021 Acute systolic (congestive) heart failure: Secondary | ICD-10-CM | POA: Diagnosis not present

## 2019-01-06 DIAGNOSIS — R0602 Shortness of breath: Secondary | ICD-10-CM | POA: Diagnosis not present

## 2019-01-06 DIAGNOSIS — I10 Essential (primary) hypertension: Secondary | ICD-10-CM | POA: Diagnosis not present

## 2019-04-14 DIAGNOSIS — I5021 Acute systolic (congestive) heart failure: Secondary | ICD-10-CM | POA: Diagnosis not present

## 2019-04-14 DIAGNOSIS — I2510 Atherosclerotic heart disease of native coronary artery without angina pectoris: Secondary | ICD-10-CM | POA: Diagnosis not present

## 2019-04-14 DIAGNOSIS — I10 Essential (primary) hypertension: Secondary | ICD-10-CM | POA: Diagnosis not present

## 2019-08-08 DIAGNOSIS — Z20822 Contact with and (suspected) exposure to covid-19: Secondary | ICD-10-CM | POA: Diagnosis not present

## 2019-08-31 ENCOUNTER — Ambulatory Visit: Payer: MEDICAID | Attending: Internal Medicine

## 2019-08-31 DIAGNOSIS — Z23 Encounter for immunization: Secondary | ICD-10-CM | POA: Insufficient documentation

## 2019-08-31 NOTE — Progress Notes (Signed)
   Covid-19 Vaccination Clinic  Name:  Bryan Shea    MRN: [id number] DOB: 1955-11-14  08/31/2019  Mr. Bryan Shea was observed post Covid-19 immunization for 15 minutes without incident. He was provided with Vaccine Information Sheet and instruction to access the V-Safe system.   Mr. Bryan Shea was instructed to call 911 with any severe reactions post vaccine: Marland Kitchen Difficulty breathing  . Swelling of face and throat  . A fast heartbeat  . A bad rash all over body  . Dizziness and weakness   Immunizations Administered    Name Date Dose VIS Date Route   Pfizer COVID-19 Vaccine 08/31/2019  9:29 AM 0.3 mL 06/06/2019 Intramuscular   Manufacturer: [place]   Lot: [id number]   [place]: [id number]

## 2019-09-23 ENCOUNTER — Ambulatory Visit: Payer: MEDICAID | Attending: Internal Medicine

## 2019-09-23 DIAGNOSIS — Z23 Encounter for immunization: Secondary | ICD-10-CM

## 2019-09-23 NOTE — Progress Notes (Signed)
   Covid-19 Vaccination Clinic  Name:  Bryan Shea    MRN: [id number] DOB: [birth year]  09/23/2019  Mr. Oertel was observed post Covid-19 immunization for 15 minutes without incident. He was provided with Vaccine Information Sheet and instruction to access the V-Safe system.   Mr. Kalafut was instructed to call 911 with any severe reactions post vaccine: Marland Kitchen Difficulty breathing  . Swelling of face and throat  . A fast heartbeat  . A bad rash all over body  . Dizziness and weakness   Immunizations Administered    Name Date Dose VIS Date Route   Pfizer COVID-19 Vaccine 09/23/2019  8:25 AM 0.3 mL 06/06/2019 Intramuscular   Manufacturer: [place]   Lot: [id number]   [place]: [id number]

## 2019-10-31 DIAGNOSIS — I509 Heart failure, unspecified: Secondary | ICD-10-CM | POA: Diagnosis not present

## 2019-10-31 DIAGNOSIS — I10 Essential (primary) hypertension: Secondary | ICD-10-CM | POA: Diagnosis not present

## 2019-10-31 DIAGNOSIS — E782 Mixed hyperlipidemia: Secondary | ICD-10-CM | POA: Diagnosis not present

## 2020-02-19 DIAGNOSIS — I509 Heart failure, unspecified: Secondary | ICD-10-CM | POA: Diagnosis not present

## 2020-02-19 DIAGNOSIS — I10 Essential (primary) hypertension: Secondary | ICD-10-CM | POA: Diagnosis not present

## 2020-02-19 DIAGNOSIS — E782 Mixed hyperlipidemia: Secondary | ICD-10-CM | POA: Diagnosis not present

## 2020-02-23 DIAGNOSIS — I509 Heart failure, unspecified: Secondary | ICD-10-CM | POA: Diagnosis not present

## 2020-02-23 DIAGNOSIS — I10 Essential (primary) hypertension: Secondary | ICD-10-CM | POA: Diagnosis not present

## 2020-02-23 DIAGNOSIS — E785 Hyperlipidemia, unspecified: Secondary | ICD-10-CM | POA: Diagnosis not present

## 2020-02-23 DIAGNOSIS — R609 Edema, unspecified: Secondary | ICD-10-CM | POA: Diagnosis not present

## 2020-03-15 DIAGNOSIS — I10 Essential (primary) hypertension: Secondary | ICD-10-CM | POA: Diagnosis not present

## 2020-03-15 DIAGNOSIS — I509 Heart failure, unspecified: Secondary | ICD-10-CM | POA: Diagnosis not present

## 2020-03-15 DIAGNOSIS — R0602 Shortness of breath: Secondary | ICD-10-CM | POA: Diagnosis not present

## 2020-03-15 DIAGNOSIS — I2510 Atherosclerotic heart disease of native coronary artery without angina pectoris: Secondary | ICD-10-CM | POA: Diagnosis not present

## 2020-03-15 DIAGNOSIS — E782 Mixed hyperlipidemia: Secondary | ICD-10-CM | POA: Diagnosis not present

## 2020-03-24 ENCOUNTER — Encounter: Payer: Self-pay | Admitting: Family Medicine

## 2020-03-24 DIAGNOSIS — I517 Cardiomegaly: Secondary | ICD-10-CM | POA: Insufficient documentation

## 2020-03-29 ENCOUNTER — Ambulatory Visit: Payer: MEDICAID | Attending: Internal Medicine

## 2020-03-29 DIAGNOSIS — Z23 Encounter for immunization: Secondary | ICD-10-CM

## 2020-03-29 NOTE — Progress Notes (Signed)
   Covid-19 Vaccination Clinic  Name:  Bryan Shea    MRN: [id number] DOB: 10-26-1955  03/29/2020  Mr. Bryan Shea was observed post Covid-19 immunization for 15 minutes without incident. He was provided with Vaccine Information Sheet and instruction to access the V-Safe system.   Mr. Bryan Shea was instructed to call 911 with any severe reactions post vaccine: Marland Kitchen Difficulty breathing  . Swelling of face and throat  . A fast heartbeat  . A bad rash all over body  . Dizziness and weakness

## 2020-03-31 ENCOUNTER — Encounter: Payer: Self-pay | Admitting: Family Medicine

## 2020-04-19 DIAGNOSIS — I509 Heart failure, unspecified: Secondary | ICD-10-CM | POA: Diagnosis not present

## 2020-04-19 DIAGNOSIS — I10 Essential (primary) hypertension: Secondary | ICD-10-CM | POA: Diagnosis not present

## 2020-04-19 DIAGNOSIS — I340 Nonrheumatic mitral (valve) insufficiency: Secondary | ICD-10-CM | POA: Diagnosis not present

## 2020-04-19 DIAGNOSIS — E785 Hyperlipidemia, unspecified: Secondary | ICD-10-CM | POA: Diagnosis not present

## 2020-05-02 NOTE — Telephone Encounter (Signed)
Encounter created in error

## 2020-07-19 LAB — LIPID PANEL
Cholesterol: 168 (ref 0–200)
HDL: 44 (ref 35–70)
LDL Cholesterol: 54
Triglycerides: 471 — AB (ref 40–160)

## 2020-07-19 LAB — BASIC METABOLIC PANEL
BUN: 16 (ref 4–21)
CO2: 21 (ref 13–22)
Chloride: 101 (ref 99–108)
Creatinine: 1.1 (ref 0.6–1.3)
Glucose: 101
Potassium: 4.9 (ref 3.4–5.3)
Sodium: 137 (ref 137–147)

## 2020-07-19 LAB — COMPREHENSIVE METABOLIC PANEL
Albumin: 5 (ref 3.5–5.0)
Calcium: 9.9 (ref 8.7–10.7)
GFR calc Af Amer: 79
GFR calc non Af Amer: 68
Globulin: 2.2

## 2020-07-19 LAB — HEPATIC FUNCTION PANEL
ALT: 37 (ref 10–40)
AST: 25 (ref 14–40)
Alkaline Phosphatase: 71 (ref 25–125)
Bilirubin, Total: 1.8

## 2020-07-19 LAB — CBC AND DIFFERENTIAL
HCT: 40 — AB (ref 41–53)
Hemoglobin: 13.4 — AB (ref 13.5–17.5)
WBC: 6.4

## 2020-07-19 LAB — CBC: RBC: 4.17 (ref 3.87–5.11)

## 2020-07-20 DIAGNOSIS — I10 Essential (primary) hypertension: Secondary | ICD-10-CM | POA: Diagnosis not present

## 2020-07-20 DIAGNOSIS — E785 Hyperlipidemia, unspecified: Secondary | ICD-10-CM | POA: Diagnosis not present

## 2020-07-20 DIAGNOSIS — I5022 Chronic systolic (congestive) heart failure: Secondary | ICD-10-CM | POA: Diagnosis not present

## 2020-07-20 DIAGNOSIS — I340 Nonrheumatic mitral (valve) insufficiency: Secondary | ICD-10-CM | POA: Diagnosis not present

## 2020-07-27 ENCOUNTER — Encounter: Payer: Self-pay | Admitting: Family Medicine

## 2020-08-20 ENCOUNTER — Other Ambulatory Visit: Payer: Self-pay

## 2020-08-20 ENCOUNTER — Ambulatory Visit
Admission: RE | Admit: 2020-08-20 | Discharge: 2020-08-20 | Disposition: A | Discharge status: Home / Self Care | Payer: BC Managed Care – PPO | Source: Ambulatory Visit | Attending: Family Medicine | Admitting: Family Medicine

## 2020-08-20 ENCOUNTER — Ambulatory Visit: Payer: BC Managed Care – PPO | Admitting: Family Medicine

## 2020-08-20 VITALS — BP 138/83 | HR 92 | Temp 97.5°F | Ht 75.0 in | Wt 254.0 lb

## 2020-08-20 DIAGNOSIS — Z23 Encounter for immunization: Secondary | ICD-10-CM | POA: Diagnosis not present

## 2020-08-20 DIAGNOSIS — E781 Pure hyperglyceridemia: Secondary | ICD-10-CM

## 2020-08-20 DIAGNOSIS — I2510 Atherosclerotic heart disease of native coronary artery without angina pectoris: Secondary | ICD-10-CM

## 2020-08-20 DIAGNOSIS — I10 Essential (primary) hypertension: Secondary | ICD-10-CM

## 2020-08-20 DIAGNOSIS — M25561 Pain in right knee: Secondary | ICD-10-CM | POA: Diagnosis not present

## 2020-08-20 DIAGNOSIS — Z125 Encounter for screening for malignant neoplasm of prostate: Secondary | ICD-10-CM

## 2020-08-20 DIAGNOSIS — E6609 Other obesity due to excess calories: Secondary | ICD-10-CM

## 2020-08-20 DIAGNOSIS — M25762 Osteophyte, left knee: Secondary | ICD-10-CM | POA: Diagnosis not present

## 2020-08-20 DIAGNOSIS — Z6830 Body mass index (BMI) 30.0-30.9, adult: Secondary | ICD-10-CM

## 2020-08-20 DIAGNOSIS — M25562 Pain in left knee: Secondary | ICD-10-CM | POA: Diagnosis not present

## 2020-08-20 DIAGNOSIS — M1712 Unilateral primary osteoarthritis, left knee: Secondary | ICD-10-CM | POA: Diagnosis not present

## 2020-08-20 DIAGNOSIS — M1711 Unilateral primary osteoarthritis, right knee: Secondary | ICD-10-CM | POA: Diagnosis not present

## 2020-08-20 DIAGNOSIS — Z86010 Personal history of colonic polyps: Secondary | ICD-10-CM

## 2020-08-20 DIAGNOSIS — Z1211 Encounter for screening for malignant neoplasm of colon: Secondary | ICD-10-CM

## 2020-08-20 NOTE — Progress Notes (Signed)
Established patient visit   Patient: Bryan Shea   DOB: 1956/02/09   65 y.o. Male  MRN: [id number] Visit Date: 08/20/2020  Today's healthcare provider: Lelon Huh, MD   No chief complaint on file.  Subjective    HPI  Patient presents at the request of his cardiologist Dr. Humphrey Rolls for routine follow up. He has history of HFpEF. He is doing well with current medication regiment and has no chest pain, palpations. He does get a little short of breath with exertion. Labs were last done by Dr. Humphrey Rolls in January as below. He has follow up with Dr. Humphrey Rolls in May.   Pt would also like to discuss weight loss methods and knee pain. Has bilateral knee pain making it very difficult to exercise and work.  Wt Readings from Last 3 Encounters:  08/20/20 254 lb (115.2 kg)  09/18/18 245 lb (111.1 kg)  08/31/18 245 lb 6 oz (111.3 kg)     Hypertension, follow-up  BP Readings from Last 3 Encounters:  09/18/18 137/86  08/31/18 (!) 147/82  06/20/16 (!) 157/94   Wt Readings from Last 3 Encounters:  09/18/18 245 lb (111.1 kg)  08/31/18 245 lb 6 oz (111.3 kg)  06/20/16 222 lb 3.2 oz (100.8 kg)     He was last seen for hypertension 2 years ago.  BP at that visit was 137/86. Management since that visit includes continue current medications.   He reports excellent compliance with treatment. He is not having side effects.  He is following a Regular diet. He is exercising. - waling He does not smoke.  Use of agents associated with hypertension: none.   Outside blood pressures are n/a. Symptoms: No chest pain No chest pressure  No palpitations No syncope  No dyspnea No orthopnea  No paroxysmal nocturnal dyspnea No lower extremity edema   Pertinent labs: Lab Results  Component Value Date   CHOL 168 07/19/2020   HDL 44 07/19/2020   LDLCALC 54 07/19/2020   TRIG 471 (A) 07/19/2020   CHOLHDL 2.1 05/24/2016   Lab Results  Component Value Date   NA 137 07/19/2020   K 4.9 07/19/2020    CREATININE 1.1 07/19/2020   GFRNONAA 68 07/19/2020   GFRAA 79 07/19/2020   GLUCOSE 101 (H) 09/18/2018     The ASCVD Risk score (Goff DC Jr., et al., 2013) failed to calculate for the following reasons:   The systolic blood pressure is missing   ---------------------------------------------------------------------------------------------------     Medications: Outpatient Medications Prior to Visit  Medication Sig Note  . aspirin 81 MG EC tablet Take 1 tablet (81 mg total) by mouth daily.   Marland Kitchen atorvastatin (LIPITOR) 40 MG tablet Take 1 tablet (40 mg total) by mouth daily.   . Coenzyme Q10 300 MG CAPS Take 1 capsule by mouth daily.   . hydrALAZINE (APRESOLINE) 25 MG tablet Take 100 mg by mouth daily.   Marland Kitchen lisinopril (ZESTRIL) 40 MG tablet Take 40 mg by mouth daily.   . Omega-3 Fatty Acids (FISH OIL) 1000 MG CAPS Take 1 capsule by mouth daily.   Marland Kitchen spironolactone (ALDACTONE) 25 MG tablet Take 25 mg by mouth daily.   . [DISCONTINUED] carvedilol (COREG) 12.5 MG tablet Take 1 tablet (12.5 mg total) by mouth 2 (two) times daily with a meal. (Patient taking differently: Take 25 mg by mouth daily.)   . [DISCONTINUED] lisinopril (PRINIVIL,ZESTRIL) 20 MG tablet Take 1 tablet (20 mg total) by mouth daily. (Patient taking differently:  Take 40 mg by mouth daily.)   . [DISCONTINUED] clopidogrel (PLAVIX) 75 MG tablet Take 1 tablet (75 mg total) by mouth daily. (Patient not taking: Reported on 08/20/2020) 08/20/2020: STOPPED BY DR Humphrey Rolls  . [DISCONTINUED] furosemide (LASIX) 40 MG tablet Take 1 tablet (40 mg total) by mouth daily. (Patient not taking: Reported on 08/20/2020) 08/20/2020: STOPPED BY CARDIOLOGY  . [DISCONTINUED] potassium chloride SA (K-DUR,KLOR-CON) 20 MEQ tablet Take 1 tablet (20 mEq total) by mouth daily. (Patient not taking: Reported on 08/20/2020) 08/20/2020: STOPPED BY CARDIOLOGY   No facility-administered medications prior to visit.    Review of Systems     Objective    BP 138/83 (BP  Location: Right Arm, Patient Position: Sitting, Cuff Size: Large)   Pulse 92   Temp (!) 97.5 F (36.4 C) (Temporal)   Ht 6\' 3"  (1.905 m)   Wt 254 lb (115.2 kg)   SpO2 98%   BMI 31.75 kg/m     Physical Exam    General: Appearance:    Obese male in no acute distress  Eyes:    PERRL, conjunctiva/corneas clear, EOM's intact       Lungs:     Clear to auscultation bilaterally, respirations unlabored  Heart:    Normal heart rate. Normal rhythm. No murmurs, rubs, or gallops.   MS:   All extremities are intact.   Neurologic:   Awake, alert, oriented x 3. No apparent focal neurological           defect.        EKG: NSR  Assessment & Plan     1. Coronary artery disease involving native coronary artery of native heart without angina pectoris Continue follow up Dr. Humphrey Rolls as scheduled.   2. Essential hypertension Well controlled.  Continue current medications.     3. Hypertriglyceridemia He is tolerating atorvastatin well with no adverse effects.    4. Class 1 obesity due to excess calories with serious comorbidity and body mass index (BMI) of 30.0 to 30.9 in adult Encourage regular exercise but is limited by knee pain as below.  - TSH - T4, free  5. Pain in both knees, unspecified chronicity  - DG Knee Complete 4 Views Left; Future - DG Knee Complete 4 Views Right; Future  Consider orthopedic referral  6. Prostate cancer screening  - PSA Total (Reflex To Free) ([facility] only)  7. Need for influenza vaccination  - Flu Vaccine QUAD 36+ mos IM (Fluarix/Fluzone)  8. H/O adenomatous polyp of colon  - Ambulatory referral to gastroenterology for colonoscopy  9. Colon cancer screening  - Ambulatory referral to gastroenterology for colonoscopy      The entirety of the information documented in the History of Present Illness, Review of Systems and Physical Exam were personally obtained by me. Portions of this information were initially documented by the CMA and reviewed by me  for thoroughness and accuracy.      Lelon Huh, MD  [facility] [phone] (phone) [phone] (fax)  [place]

## 2020-08-21 LAB — PSA TOTAL (REFLEX TO FREE): Prostate Specific Ag, Serum: 0.4 ng/mL (ref 0.0–4.0)

## 2020-08-21 LAB — T4, FREE: Free T4: 1.31 ng/dL (ref 0.82–1.77)

## 2020-08-21 LAB — TSH: TSH: 1.71 u[IU]/mL (ref 0.450–4.500)

## 2020-08-23 ENCOUNTER — Telehealth: Payer: Self-pay

## 2020-08-23 DIAGNOSIS — M170 Bilateral primary osteoarthritis of knee: Secondary | ICD-10-CM

## 2020-08-23 DIAGNOSIS — M25561 Pain in right knee: Secondary | ICD-10-CM

## 2020-08-23 DIAGNOSIS — M25562 Pain in left knee: Secondary | ICD-10-CM

## 2020-08-23 NOTE — Telephone Encounter (Signed)
-----   Message from Birdie Sons, MD sent at 08/23/2020  6:48 AM EST ----- Thyroid functions and PSA are normal. Continue current medications.  Check yearly.  Xrays show severe arthritis in both knees, recommend referral to orthopedics.

## 2020-08-23 NOTE — Telephone Encounter (Signed)
Pt given lab results per notes of Dr. Caryn Section  on 08/23/20. Pt verbalized understanding. Patient requesting PCP to complete orthopedic referral. Patient asking about colonoscopy. Reviewed with patient PCP notes reports ambulatory referral for gastroenterology for colonoscopy. Reinforced that the referred  clinics will contact the patient for scheduling.

## 2020-08-24 DIAGNOSIS — M170 Bilateral primary osteoarthritis of knee: Secondary | ICD-10-CM | POA: Insufficient documentation

## 2020-08-24 NOTE — Addendum Note (Signed)
Addended by: Birdie Sons on: 08/24/2020 08:03 AM   Modules accepted: Orders

## 2020-09-06 DIAGNOSIS — M170 Bilateral primary osteoarthritis of knee: Secondary | ICD-10-CM | POA: Diagnosis not present

## 2020-09-27 DIAGNOSIS — M170 Bilateral primary osteoarthritis of knee: Secondary | ICD-10-CM | POA: Diagnosis not present

## 2020-11-18 DIAGNOSIS — I509 Heart failure, unspecified: Secondary | ICD-10-CM | POA: Diagnosis not present

## 2020-11-18 DIAGNOSIS — I10 Essential (primary) hypertension: Secondary | ICD-10-CM | POA: Diagnosis not present

## 2020-11-18 DIAGNOSIS — I2510 Atherosclerotic heart disease of native coronary artery without angina pectoris: Secondary | ICD-10-CM | POA: Diagnosis not present

## 2020-11-18 DIAGNOSIS — R0602 Shortness of breath: Secondary | ICD-10-CM | POA: Diagnosis not present

## 2020-11-29 DIAGNOSIS — I420 Dilated cardiomyopathy: Secondary | ICD-10-CM | POA: Diagnosis not present

## 2020-12-03 DIAGNOSIS — I2510 Atherosclerotic heart disease of native coronary artery without angina pectoris: Secondary | ICD-10-CM | POA: Diagnosis not present

## 2020-12-03 DIAGNOSIS — I10 Essential (primary) hypertension: Secondary | ICD-10-CM | POA: Diagnosis not present

## 2020-12-03 DIAGNOSIS — I509 Heart failure, unspecified: Secondary | ICD-10-CM | POA: Diagnosis not present

## 2020-12-03 DIAGNOSIS — R0602 Shortness of breath: Secondary | ICD-10-CM | POA: Diagnosis not present

## 2021-01-14 DIAGNOSIS — I509 Heart failure, unspecified: Secondary | ICD-10-CM | POA: Diagnosis not present

## 2021-01-14 DIAGNOSIS — I10 Essential (primary) hypertension: Secondary | ICD-10-CM | POA: Diagnosis not present

## 2021-01-14 DIAGNOSIS — E782 Mixed hyperlipidemia: Secondary | ICD-10-CM | POA: Diagnosis not present

## 2021-01-14 DIAGNOSIS — I2510 Atherosclerotic heart disease of native coronary artery without angina pectoris: Secondary | ICD-10-CM | POA: Diagnosis not present

## 2021-01-18 ENCOUNTER — Other Ambulatory Visit: Payer: Self-pay | Admitting: Gastroenterology

## 2021-01-18 DIAGNOSIS — Z86010 Personal history of colonic polyps: Secondary | ICD-10-CM | POA: Diagnosis not present

## 2021-01-18 DIAGNOSIS — K7400 Hepatic fibrosis, unspecified: Secondary | ICD-10-CM

## 2021-02-08 ENCOUNTER — Ambulatory Visit: Payer: BC Managed Care – PPO

## 2021-02-11 ENCOUNTER — Ambulatory Visit
Admission: RE | Admit: 2021-02-11 | Discharge: 2021-02-11 | Disposition: A | Discharge status: Home / Self Care | Payer: BC Managed Care – PPO | Source: Ambulatory Visit | Attending: Gastroenterology | Admitting: Gastroenterology

## 2021-02-11 ENCOUNTER — Other Ambulatory Visit: Payer: Self-pay

## 2021-02-11 DIAGNOSIS — K7689 Other specified diseases of liver: Secondary | ICD-10-CM | POA: Diagnosis not present

## 2021-02-11 DIAGNOSIS — K7400 Hepatic fibrosis, unspecified: Secondary | ICD-10-CM | POA: Insufficient documentation

## 2021-02-11 DIAGNOSIS — K8020 Calculus of gallbladder without cholecystitis without obstruction: Secondary | ICD-10-CM | POA: Diagnosis not present

## 2021-03-24 DIAGNOSIS — E782 Mixed hyperlipidemia: Secondary | ICD-10-CM | POA: Diagnosis not present

## 2021-03-24 DIAGNOSIS — I509 Heart failure, unspecified: Secondary | ICD-10-CM | POA: Diagnosis not present

## 2021-03-24 DIAGNOSIS — I2510 Atherosclerotic heart disease of native coronary artery without angina pectoris: Secondary | ICD-10-CM | POA: Diagnosis not present

## 2021-03-24 DIAGNOSIS — I10 Essential (primary) hypertension: Secondary | ICD-10-CM | POA: Diagnosis not present

## 2021-04-28 ENCOUNTER — Encounter: Payer: Self-pay | Admitting: *Deleted

## 2021-04-29 ENCOUNTER — Encounter: Payer: Self-pay | Admitting: *Deleted

## 2021-04-29 ENCOUNTER — Ambulatory Visit
Admission: RE | Admit: 2021-04-29 | Discharge: 2021-04-29 | Disposition: A | Discharge status: Home / Self Care | Payer: BC Managed Care – PPO | Attending: Gastroenterology | Admitting: Gastroenterology

## 2021-04-29 ENCOUNTER — Encounter
Admission: RE | Disposition: A | Discharge status: Home / Self Care | Payer: Self-pay | Source: Home / Self Care | Attending: Gastroenterology

## 2021-04-29 ENCOUNTER — Other Ambulatory Visit: Payer: Self-pay

## 2021-04-29 ENCOUNTER — Ambulatory Visit: Payer: BC Managed Care – PPO | Admitting: Anesthesiology

## 2021-04-29 DIAGNOSIS — Z1211 Encounter for screening for malignant neoplasm of colon: Secondary | ICD-10-CM | POA: Insufficient documentation

## 2021-04-29 DIAGNOSIS — Z8619 Personal history of other infectious and parasitic diseases: Secondary | ICD-10-CM | POA: Insufficient documentation

## 2021-04-29 DIAGNOSIS — K640 First degree hemorrhoids: Secondary | ICD-10-CM | POA: Insufficient documentation

## 2021-04-29 DIAGNOSIS — K5730 Diverticulosis of large intestine without perforation or abscess without bleeding: Secondary | ICD-10-CM | POA: Diagnosis not present

## 2021-04-29 DIAGNOSIS — D122 Benign neoplasm of ascending colon: Secondary | ICD-10-CM | POA: Insufficient documentation

## 2021-04-29 DIAGNOSIS — I10 Essential (primary) hypertension: Secondary | ICD-10-CM | POA: Insufficient documentation

## 2021-04-29 DIAGNOSIS — Z86010 Personal history of colonic polyps: Secondary | ICD-10-CM | POA: Diagnosis not present

## 2021-04-29 DIAGNOSIS — D123 Benign neoplasm of transverse colon: Secondary | ICD-10-CM | POA: Insufficient documentation

## 2021-04-29 DIAGNOSIS — K635 Polyp of colon: Secondary | ICD-10-CM | POA: Diagnosis not present

## 2021-04-29 HISTORY — PX: COLONOSCOPY WITH PROPOFOL: SHX5780

## 2021-04-29 SURGERY — COLONOSCOPY WITH PROPOFOL
Anesthesia: General

## 2021-04-29 MED ORDER — PROPOFOL 500 MG/50ML IV EMUL
INTRAVENOUS | Status: AC
  Filled 2021-04-29: qty 50

## 2021-04-29 MED ORDER — SODIUM CHLORIDE 0.9 % IV SOLN: INTRAVENOUS | Status: DC

## 2021-04-29 MED ORDER — PROPOFOL 10 MG/ML IV BOLUS
INTRAVENOUS | Status: DC | PRN
  Administered 2021-04-29 (×3): 100 mg via INTRAVENOUS

## 2021-04-29 MED ORDER — LIDOCAINE 2% (20 MG/ML) 5 ML SYRINGE
INTRAMUSCULAR | Status: DC | PRN
  Administered 2021-04-29: 50 mg via INTRAVENOUS

## 2021-04-29 MED ORDER — PROPOFOL 500 MG/50ML IV EMUL
INTRAVENOUS | Status: DC | PRN
  Administered 2021-04-29: 150 ug/kg/min via INTRAVENOUS

## 2021-04-29 NOTE — Transfer of Care (Signed)
Immediate Anesthesia Transfer of Care Note  Patient: Bryan Shea  Procedure(s) Performed: COLONOSCOPY WITH PROPOFOL  Patient Location: Endoscopy Unit  Anesthesia Type:General  Level of Consciousness: awake  Airway & Oxygen Therapy: Patient Spontanous Breathing  Post-op Assessment: Report given to RN and Post -op Vital signs reviewed and stable  Post vital signs: Reviewed and stable  Last Vitals:  Vitals Value Taken Time  BP 123/84 04/29/21 1401  Temp 36.1 C 04/29/21 1400  Pulse 82 04/29/21 1401  Resp 19 04/29/21 1401  SpO2 99 % 04/29/21 1401  Vitals shown include unvalidated device data.  Last Pain:  Vitals:   04/29/21 1400  TempSrc: Temporal  PainSc:          Complications: No notable events documented.

## 2021-04-29 NOTE — Interval H&P Note (Signed)
History and Physical Interval Note:  04/29/2021 1:27 PM  Bryan Shea  has presented today for surgery, with the diagnosis of HISTORY OF ADENOMATOUS POLPS (Z86.010).  The various methods of treatment have been discussed with the patient and family. After consideration of risks, benefits and other options for treatment, the patient has consented to  Procedure(s): COLONOSCOPY WITH PROPOFOL (N/A) as a surgical intervention.  The patient's history has been reviewed, patient examined, no change in status, stable for surgery.  I have reviewed the patient's chart and labs.  Questions were answered to the patient's satisfaction.     Lesly Rubenstein  Ok to proceed with colonoscopy

## 2021-04-29 NOTE — Anesthesia Preprocedure Evaluation (Signed)
Anesthesia Evaluation  Patient identified by MRN, date of birth, ID band Patient awake    Reviewed: Allergy & Precautions, H&P , NPO status , Patient's Chart, lab work & pertinent test results  History of Anesthesia Complications Negative for: history of anesthetic complications  Airway Mallampati: III  TM Distance: >3 FB Neck ROM: full    Dental no notable dental hx.    Pulmonary neg pulmonary ROS, neg sleep apnea, neg COPD,    Pulmonary exam normal        Cardiovascular hypertension, (-) angina+ CAD  (-) Past MI and (-) Cardiac Stents Normal cardiovascular exam(-) dysrhythmias      Neuro/Psych negative neurological ROS  negative psych ROS   GI/Hepatic negative GI ROS, (+) Hepatitis -, C  Endo/Other  negative endocrine ROS  Renal/GU negative Renal ROS  negative genitourinary   Musculoskeletal   Abdominal   Peds  Hematology negative hematology ROS (+)   Anesthesia Other Findings Past Medical History: No date: Hepatitis C virus No date: History of chicken pox No date: Hypertension No date: Testicular cancer [facility])  Past Surgical History: 09/28/2015: CARDIAC CATHETERIZATION; Right     Comment:  Procedure: Left Heart Cath and Coronary Angiography;                Surgeon: Dionisio David, MD;  Location: [facility]               [facility];  Service: Cardiovascular;  Laterality: Right; 11/11/2009: LIVER BIOPSY     Comment:  Chronic Hepatitis. Graed 3. Early stage 2.5% steatosis.               Focal ironidentified in Kupffer cells 1994: TESTICLE REMOVAL     Comment:  testicular mass excision  BMI    Body Mass Index: 30.37 kg/m      Reproductive/Obstetrics negative OB ROS                             Anesthesia Physical Anesthesia Plan  ASA: 2  Anesthesia Plan: General   Post-op Pain Management:    Induction:   PONV Risk Score and Plan: Propofol infusion and TIVA  Airway  Management Planned: Nasal Cannula  Additional Equipment:   Intra-op Plan:   Post-operative Plan:   Informed Consent: I have reviewed the patients History and Physical, chart, labs and discussed the procedure including the risks, benefits and alternatives for the proposed anesthesia with the patient or authorized representative who has indicated his/her understanding and acceptance.     Dental Advisory Given  Plan Discussed with: Anesthesiologist, CRNA and Surgeon  Anesthesia Plan Comments:         Anesthesia Quick Evaluation

## 2021-04-29 NOTE — H&P (Signed)
Outpatient short stay form Pre-procedure 04/29/2021  Lesly Rubenstein, MD  Primary Physician: Birdie Sons, MD  Reason for visit:  Surveillance colonoscopy  History of present illness:   65 y/o gentleman with history of F3 fibrosis from hep c and hypertension here for surveillance colonoscopy. Last colonoscopy was 7 years and he had a TA. No significant abdominal surgeries. No blood thinners. No family history of GI malignancies.    Current Facility-Administered Medications:    0.9 %  sodium chloride infusion, , Intravenous, Continuous, Terrian Sentell, Hilton Cork, MD, Last Rate: 20 mL/hr at 04/29/21 1253, New Bag at 04/29/21 1253  Medications Prior to Admission  Medication Sig Dispense Refill Last Dose   amLODipine (NORVASC) 5 MG tablet Take 5 mg by mouth daily.   04/29/2021   aspirin 81 MG EC tablet Take 1 tablet (81 mg total) by mouth daily. 30 tablet 0 04/28/2021   atorvastatin (LIPITOR) 40 MG tablet Take 1 tablet (40 mg total) by mouth daily. 30 tablet 0 04/28/2021   carvedilol (COREG) 25 MG tablet Take 25 mg by mouth 2 (two) times daily with a meal.   04/29/2021   Coenzyme Q10 300 MG CAPS Take 1 capsule by mouth daily.   Past Month   hydrALAZINE (APRESOLINE) 25 MG tablet Take 100 mg by mouth daily.   04/29/2021   lisinopril (ZESTRIL) 40 MG tablet Take 40 mg by mouth daily.   04/29/2021   meloxicam (MOBIC) 15 MG tablet Take 15 mg by mouth daily.   Past Week   Omega-3 Fatty Acids (FISH OIL) 1000 MG CAPS Take 1 capsule by mouth daily.   Past Month   spironolactone (ALDACTONE) 25 MG tablet Take 25 mg by mouth daily.   04/28/2021     No Known Allergies   Past Medical History:  Diagnosis Date   Hepatitis C virus    History of chicken pox    Hypertension    Testicular cancer ([place])     Review of systems:  Otherwise negative.    Physical Exam  Gen: Alert, oriented. Appears stated age.  HEENT: PERRLA. Lungs: No respiratory distress CV: RRR Abd: soft, benign, no masses Ext: No  edema    Planned procedures: Proceed with colonoscopy. The patient understands the nature of the planned procedure, indications, risks, alternatives and potential complications including but not limited to bleeding, infection, perforation, damage to internal organs and possible oversedation/side effects from anesthesia. The patient agrees and gives consent to proceed.  Please refer to procedure notes for findings, recommendations and patient disposition/instructions.     Lesly Rubenstein, MD [facility] G[facility]enter[facility]

## 2021-04-29 NOTE — Op Note (Signed)
[facility] Gastroenterology Patient Name: Bryan Shea Procedure Date: 04/29/2021 1:15 PM MRN: [id number] Account #: [id number] Date of Birth: September 24, 1955 Admit Type: Outpatient Age: 65 Room: [facility] ENDO ROOM 3 Gender: Male Note Status: Finalized Instrument Name: Jasper Riling [id number] Procedure:             Colonoscopy Indications:           Surveillance: Personal history of adenomatous polyps                         on last colonoscopy > 5 years ago Providers:             Andrey Farmer MD, MD Medicines:             Monitored Anesthesia Care Complications:         No immediate complications. Estimated blood loss:                         Minimal. Procedure:             Pre-Anesthesia Assessment:                        - Prior to the procedure, a History and Physical was                         performed, and patient medications and allergies were                         reviewed. The patient is competent. The risks and                         benefits of the procedure and the sedation options and                         risks were discussed with the patient. All questions                         were answered and informed consent was obtained.                         Patient identification and proposed procedure were                         verified by the physician, the nurse, the anesthetist                         and the technician in the endoscopy suite. Mental                         Status Examination: alert and oriented. Airway                         Examination: normal oropharyngeal airway and neck                         mobility. Respiratory Examination: clear to                         auscultation. CV Examination: normal. Prophylactic  Antibiotics: The patient does not require prophylactic                         antibiotics. Prior Anticoagulants: The patient has                         taken no previous anticoagulant or  antiplatelet                         agents. ASA Grade Assessment: III - A patient with                         severe systemic disease. After reviewing the risks and                         benefits, the patient was deemed in satisfactory                         condition to undergo the procedure. The anesthesia                         plan was to use monitored anesthesia care (MAC).                         Immediately prior to administration of medications,                         the patient was re-assessed for adequacy to receive                         sedatives. The heart rate, respiratory rate, oxygen                         saturations, blood pressure, adequacy of pulmonary                         ventilation, and response to care were monitored                         throughout the procedure. The physical status of the                         patient was re-assessed after the procedure.                        After obtaining informed consent, the colonoscope was                         passed under direct vision. Throughout the procedure,                         the patient's blood pressure, pulse, and oxygen                         saturations were monitored continuously. The                         Colonoscope was introduced through the anus and  advanced to the the cecum, identified by appendiceal                         orifice and ileocecal valve. The colonoscopy was                         performed without difficulty. The patient tolerated                         the procedure well. The quality of the bowel                         preparation was adequate to identify polyps. Findings:      The perianal and digital rectal examinations were normal.      A 2 mm polyp was found in the ascending colon. The polyp was sessile.       The polyp was removed with a jumbo cold forceps. Resection and retrieval       were complete. Estimated blood loss was  minimal.      A 2 mm polyp was found in the transverse colon. The polyp was sessile.       The polyp was removed with a jumbo cold forceps. Resection and retrieval       were complete. Estimated blood loss was minimal.      Two sessile polyps were found in the transverse colon. The polyps were 2       to 3 mm in size. These polyps were removed with a cold snare. Resection       and retrieval were complete. Estimated blood loss was minimal.      Scattered small-mouthed diverticula were found in the sigmoid colon.      Internal hemorrhoids were found during retroflexion. The hemorrhoids       were Grade I (internal hemorrhoids that do not prolapse).      The exam was otherwise without abnormality on direct and retroflexion       views. Impression:            - One 2 mm polyp in the ascending colon, removed with                         a jumbo cold forceps. Resected and retrieved.                        - One 2 mm polyp in the transverse colon, removed with                         a jumbo cold forceps. Resected and retrieved.                        - Two 2 to 3 mm polyps in the transverse colon,                         removed with a cold snare. Resected and retrieved.                        - Diverticulosis in the sigmoid colon.                        -  Internal hemorrhoids.                        - The examination was otherwise normal on direct and                         retroflexion views. Recommendation:        - Discharge patient to home.                        - Resume previous diet.                        - Continue present medications.                        - Await pathology results.                        - Repeat colonoscopy for surveillance based on                         pathology results.                        - Return to referring physician as previously                         scheduled. Procedure Code(s):     --- Professional ---                        [phone],  Colonoscopy, flexible; with removal of                         tumor(s), polyp(s), or other lesion(s) by snare                         technique                        45380, 74, Colonoscopy, flexible; with biopsy, single                         or multiple Diagnosis Code(s):     --- Professional ---                        K63.5, Polyp of colon                        Z86.010, Personal history of colonic polyps                        K64.0, First degree hemorrhoids                        K57.30, Diverticulosis of large intestine without                         perforation or abscess without bleeding CPT copyright 2019 [employer]. All rights reserved. The codes documented in this report are preliminary and upon coder review may  be revised to meet current compliance requirements. Andrey Farmer MD, MD  04/29/2021 2:02:08 PM Number of Addenda: 0 Note Initiated On: 04/29/2021 1:15 PM Scope Withdrawal Time: 0 hours 12 minutes 18 seconds  Total Procedure Duration: 0 hours 20 minutes 44 seconds  Estimated Blood Loss:  Estimated blood loss was minimal.      [facility]

## 2021-05-02 ENCOUNTER — Encounter: Payer: Self-pay | Admitting: Gastroenterology

## 2021-05-02 LAB — SURGICAL PATHOLOGY

## 2021-05-02 NOTE — Anesthesia Postprocedure Evaluation (Signed)
Anesthesia Post Note  Patient: Bryan Shea  Procedure(s) Performed: COLONOSCOPY WITH PROPOFOL  Patient location during evaluation: PACU Anesthesia Type: General Level of consciousness: awake and alert Pain management: pain level controlled Vital Signs Assessment: post-procedure vital signs reviewed and stable Respiratory status: spontaneous breathing, nonlabored ventilation, respiratory function stable and patient connected to nasal cannula oxygen Cardiovascular status: blood pressure returned to baseline and stable Postop Assessment: no apparent nausea or vomiting Anesthetic complications: no   No notable events documented.   Last Vitals:  Vitals:   04/29/21 1249 04/29/21 1400  BP: (!) 155/95   Pulse: 79   Resp: 16   Temp: 36.5 C (!) 36.1 C  SpO2: 98%     Last Pain:  Vitals:   04/29/21 1430  TempSrc:   PainSc: 0-No pain                 Brett Canales Harmonie Verrastro

## 2021-07-14 DIAGNOSIS — I10 Essential (primary) hypertension: Secondary | ICD-10-CM | POA: Diagnosis not present

## 2021-07-14 DIAGNOSIS — I2510 Atherosclerotic heart disease of native coronary artery without angina pectoris: Secondary | ICD-10-CM | POA: Diagnosis not present

## 2021-07-14 DIAGNOSIS — I509 Heart failure, unspecified: Secondary | ICD-10-CM | POA: Diagnosis not present

## 2021-07-14 DIAGNOSIS — R0602 Shortness of breath: Secondary | ICD-10-CM | POA: Diagnosis not present

## 2021-08-10 ENCOUNTER — Other Ambulatory Visit: Payer: Self-pay | Admitting: Gastroenterology

## 2021-08-10 ENCOUNTER — Other Ambulatory Visit (HOSPITAL_COMMUNITY): Payer: Self-pay | Admitting: Gastroenterology

## 2021-08-10 DIAGNOSIS — K7400 Hepatic fibrosis, unspecified: Secondary | ICD-10-CM

## 2021-08-17 ENCOUNTER — Ambulatory Visit: Payer: BC Managed Care – PPO | Admitting: Family Medicine

## 2021-08-17 ENCOUNTER — Other Ambulatory Visit: Payer: Self-pay

## 2021-08-17 ENCOUNTER — Encounter: Payer: Self-pay | Admitting: Family Medicine

## 2021-08-17 VITALS — BP 80/52 | HR 82 | Temp 98.3°F | Resp 16 | Wt 244.0 lb

## 2021-08-17 DIAGNOSIS — Z803 Family history of malignant neoplasm of breast: Secondary | ICD-10-CM

## 2021-08-17 DIAGNOSIS — Z809 Family history of malignant neoplasm, unspecified: Secondary | ICD-10-CM | POA: Diagnosis not present

## 2021-08-17 DIAGNOSIS — N6459 Other signs and symptoms in breast: Secondary | ICD-10-CM | POA: Diagnosis not present

## 2021-08-17 NOTE — Progress Notes (Signed)
I,Roshena L Chambers,acting as a scribe for Lelon Huh, MD.,have documented all relevant documentation on the behalf of Lelon Huh, MD,as directed by  Lelon Huh, MD while in the presence of Lelon Huh, MD.    Established patient visit   Patient: Bryan Shea   DOB: May 26, 1956   66 y.o. Male  MRN: [id number] Visit Date: 08/17/2021  Today's healthcare provider: Lelon Huh, MD   Chief Complaint  Patient presents with   Nipple Porblem   Subjective    HPI  Nipple problem: Patient reports itching and swelling of the right nipple. He states "the right nipple seems to be larger than the left". He denies any pain or redness. His mother died of breast cancer and his brother has soft tissue sarcoma and patient is concerned about possibility of tumor or cancer.   Medications: Outpatient Medications Prior to Visit  Medication Sig   amLODipine (NORVASC) 5 MG tablet Take 5 mg by mouth daily.   aspirin 81 MG EC tablet Take 1 tablet (81 mg total) by mouth daily.   atorvastatin (LIPITOR) 40 MG tablet Take 1 tablet (40 mg total) by mouth daily.   carvedilol (COREG) 25 MG tablet Take 25 mg by mouth 2 (two) times daily with a meal.   Coenzyme Q10 300 MG CAPS Take 1 capsule by mouth daily.   hydrALAZINE (APRESOLINE) 25 MG tablet Take 100 mg by mouth daily.   lisinopril (ZESTRIL) 40 MG tablet Take 40 mg by mouth daily.   meloxicam (MOBIC) 15 MG tablet Take 15 mg by mouth daily.   Omega-3 Fatty Acids (FISH OIL) 1000 MG CAPS Take 1 capsule by mouth daily.   spironolactone (ALDACTONE) 25 MG tablet Take 25 mg by mouth daily.   No facility-administered medications prior to visit.    Review of Systems  Constitutional:  Negative for appetite change, chills and fever.  Respiratory:  Negative for chest tightness, shortness of breath and wheezing.   Cardiovascular:  Negative for chest pain and palpitations.  Gastrointestinal:  Negative for abdominal pain, nausea and vomiting.   Skin:        Itchiness of right nipple      Objective    BP (!) 80/52 (BP Location: Right Arm, Patient Position: Sitting, Cuff Size: Large)    Pulse 82    Temp 98.3 F (36.8 C) (Oral)    Resp 16    Wt 244 lb (110.7 kg)    SpO2 99% Comment: room air   BMI 30.50 kg/m  {Show previous vital signs (optional):23777} Today's Vitals   08/17/21 1105 08/17/21 1110 08/17/21 1113  BP: (!) 88/58 94/63 (!) 80/52  Pulse: 82    Resp: 16    Temp: 98.3 F (36.8 C)    TempSrc: Oral    SpO2: 99%    Weight: 244 lb (110.7 kg)     Body mass index is 30.5 kg/m.   Physical Exam  Vague fullness of right retro areolar area. No discrete masses, no erythema, no nipple discharge, no tenderness of nipple or areola.   Assessment & Plan     1. Family history of breast cancer  - MM Digital Diagnostic Unilat R; Future - US BREAST LTD UNI RIGHT INC AXILLA; Future  2. Family history of cancer  - MM Digital Diagnostic Unilat R; Future - US BREAST LTD UNI RIGHT INC AXILLA; Future  3. Abnormal breast exam  - MM Digital Diagnostic Unilat R; Future - US BREAST LTD UNI RIGHT INC  AXILLA; Future         The entirety of the information documented in the History of Present Illness, Review of Systems and Physical Exam were personally obtained by me. Portions of this information were initially documented by the CMA and reviewed by me for thoroughness and accuracy.     Lelon Huh, MD  [facility] [phone] (phone) [phone] (fax)  [place]

## 2021-08-19 ENCOUNTER — Telehealth: Payer: Self-pay | Admitting: Family Medicine

## 2021-08-19 NOTE — Telephone Encounter (Signed)
Pt is calling back. He missed a call. He would like to know if the order for his [place] has been placed? Please advise [phone]

## 2021-08-24 ENCOUNTER — Telehealth: Payer: Self-pay

## 2021-08-24 DIAGNOSIS — N6459 Other signs and symptoms in breast: Secondary | ICD-10-CM

## 2021-08-24 DIAGNOSIS — Z803 Family history of malignant neoplasm of breast: Secondary | ICD-10-CM

## 2021-08-24 NOTE — Telephone Encounter (Signed)
Copied from [place] [id number]. Topic: General - Other ?>> Aug 24, 2021  4:40 PM Parke Poisson wrote: ?Reason for CRM: Per [facility] they will need an order for bilateral diagnostic mammogram TOMO [id number]. They will also need an order for limited left breast ultrasound [id number] ?

## 2021-08-25 NOTE — Telephone Encounter (Signed)
New orders placed. Please schedule.  ?

## 2021-09-13 ENCOUNTER — Ambulatory Visit
Admission: RE | Admit: 2021-09-13 | Discharge: 2021-09-13 | Disposition: A | Discharge status: Home / Self Care | Payer: BC Managed Care – PPO | Source: Ambulatory Visit | Attending: Family Medicine | Admitting: Family Medicine

## 2021-09-13 ENCOUNTER — Other Ambulatory Visit: Payer: Self-pay

## 2021-09-13 DIAGNOSIS — N6459 Other signs and symptoms in breast: Secondary | ICD-10-CM | POA: Insufficient documentation

## 2021-09-13 DIAGNOSIS — Z803 Family history of malignant neoplasm of breast: Secondary | ICD-10-CM | POA: Diagnosis not present

## 2021-09-13 DIAGNOSIS — N62 Hypertrophy of breast: Secondary | ICD-10-CM | POA: Diagnosis not present

## 2021-09-13 DIAGNOSIS — Z809 Family history of malignant neoplasm, unspecified: Secondary | ICD-10-CM | POA: Diagnosis present

## 2021-09-18 ENCOUNTER — Other Ambulatory Visit: Payer: Self-pay | Admitting: Family Medicine

## 2021-09-18 DIAGNOSIS — N62 Hypertrophy of breast: Secondary | ICD-10-CM

## 2021-09-20 ENCOUNTER — Telehealth: Payer: Self-pay | Admitting: Family Medicine

## 2021-09-20 NOTE — Telephone Encounter (Unsigned)
Copied from [place] [phone]. Topic: General - Other ?>> Sep 20, 2021  4:34 PM Yvette Rack wrote: ?Reason for CRM: Pt returned call for lab results. Pt requests call back. Cb# [phone] ?

## 2021-09-21 NOTE — Telephone Encounter (Signed)
Pt called in to return a call from PCP, pt requested a call back, please advise ?

## 2021-10-25 ENCOUNTER — Ambulatory Visit: Payer: BC Managed Care – PPO | Admitting: Family Medicine

## 2022-07-24 DIAGNOSIS — I509 Heart failure, unspecified: Secondary | ICD-10-CM | POA: Diagnosis not present

## 2022-07-24 DIAGNOSIS — I110 Hypertensive heart disease with heart failure: Secondary | ICD-10-CM | POA: Diagnosis not present

## 2022-07-24 DIAGNOSIS — I10 Essential (primary) hypertension: Secondary | ICD-10-CM | POA: Diagnosis not present

## 2022-07-24 DIAGNOSIS — E782 Mixed hyperlipidemia: Secondary | ICD-10-CM | POA: Diagnosis not present

## 2022-08-02 ENCOUNTER — Telehealth: Payer: Self-pay

## 2022-08-02 ENCOUNTER — Other Ambulatory Visit: Payer: Self-pay

## 2022-08-02 DIAGNOSIS — I10 Essential (primary) hypertension: Secondary | ICD-10-CM

## 2022-08-02 DIAGNOSIS — I2510 Atherosclerotic heart disease of native coronary artery without angina pectoris: Secondary | ICD-10-CM

## 2022-08-02 MED ORDER — CARVEDILOL 25 MG PO TABS: 25.0000 mg | ORAL_TABLET | Freq: Two times a day (BID) | ORAL | 1 refills | Status: DC

## 2022-08-02 MED ORDER — ATORVASTATIN CALCIUM 40 MG PO TABS: 40.0000 mg | ORAL_TABLET | Freq: Every day | ORAL | 0 refills | Status: DC

## 2022-08-02 MED ORDER — SPIRONOLACTONE 25 MG PO TABS: 25.0000 mg | ORAL_TABLET | Freq: Every day | ORAL | 1 refills | Status: DC

## 2022-08-03 NOTE — Telephone Encounter (Signed)
Rx request 

## 2022-08-22 ENCOUNTER — Other Ambulatory Visit: Payer: Self-pay

## 2022-08-24 ENCOUNTER — Ambulatory Visit: Payer: BC Managed Care – PPO | Admitting: Cardiovascular Disease

## 2022-09-01 ENCOUNTER — Ambulatory Visit: Payer: BC Managed Care – PPO | Admitting: Cardiovascular Disease

## 2022-09-21 ENCOUNTER — Ambulatory Visit: Payer: BC Managed Care – PPO | Admitting: Cardiovascular Disease

## 2022-09-29 ENCOUNTER — Ambulatory Visit: Payer: BC Managed Care – PPO | Admitting: Cardiovascular Disease

## 2022-10-08 IMAGING — MG DIGITAL DIAGNOSTIC BILAT W/ TOMO W/ CAD
8 series · 9 of 24 positions shown · non-contrast
Comparison: None.

ACR Breast Density Category a: The breast tissue is almost entirely
fatty.

CLINICAL DATA: 65-year-old female presenting for evaluation of
"puffiness" particularly of the right breast. He also has some
sensation of itchiness of the right breast. No palpable lumps and no
pain. His mother did passed away from breast cancer.

EXAM:
DIGITAL DIAGNOSTIC BILATERAL MAMMOGRAM WITH TOMOSYNTHESIS AND CAD
TECHNIQUE: Bilateral digital diagnostic mammography and breast tomosynthesis
was performed. The images were evaluated with computer-aided
detection.

[R MLO synth-2D]
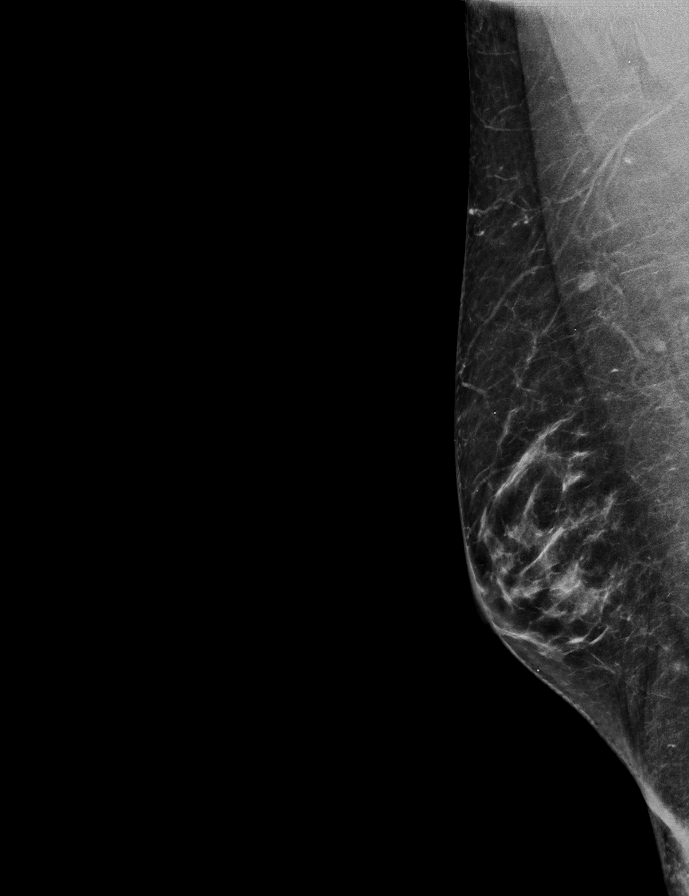

[R CC synth-2D]
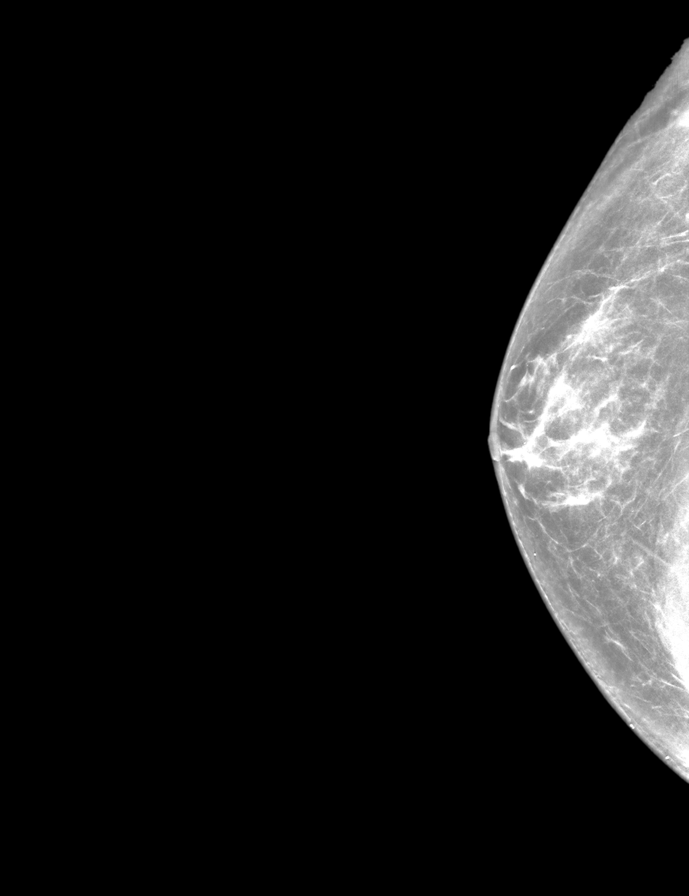

[L CC synth-2D]
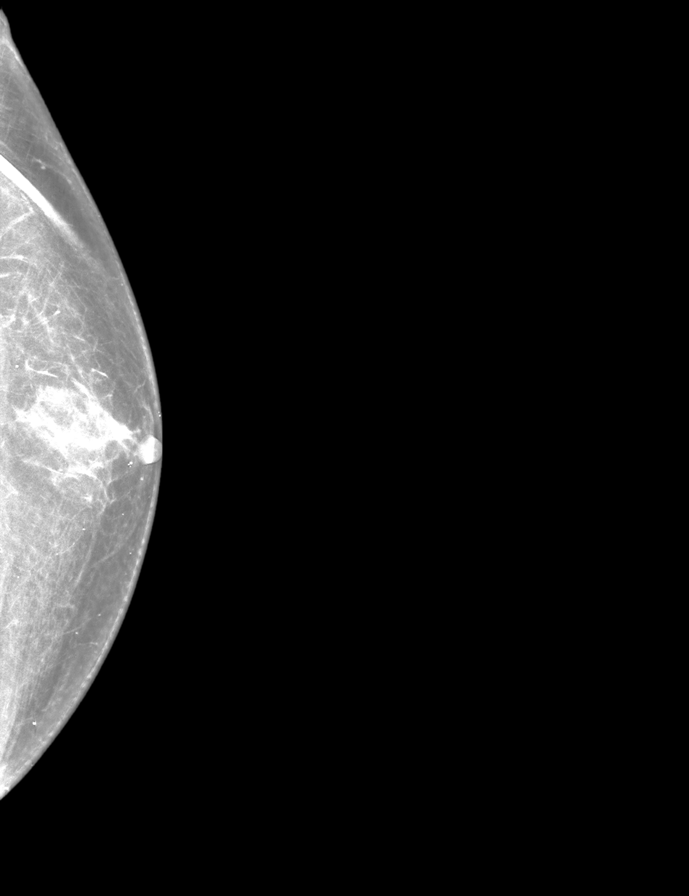

[L MLO synth-2D]
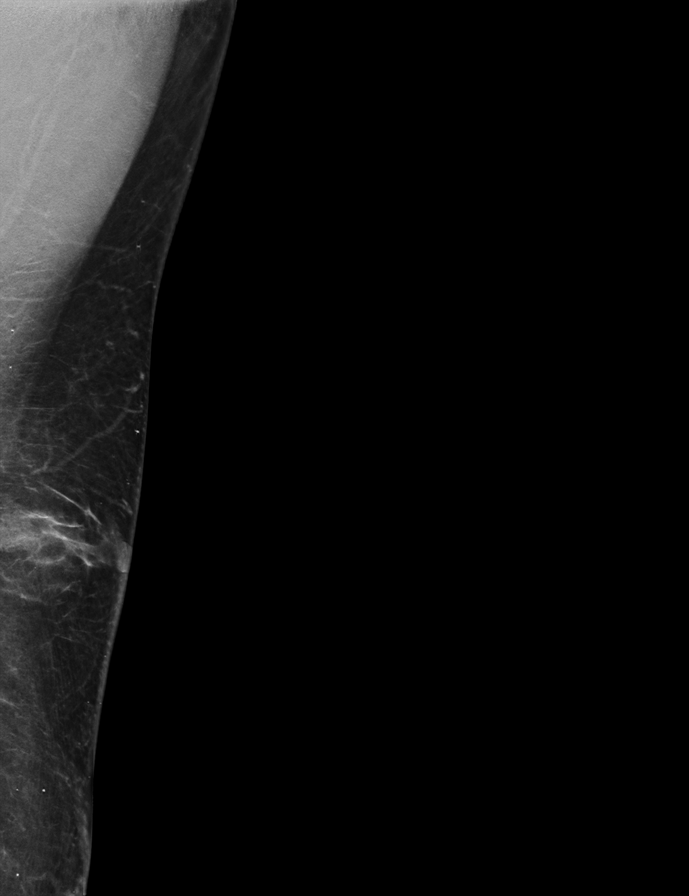

[L MLO tomo · 2 of 69 frames shown]
[frame 23/69]
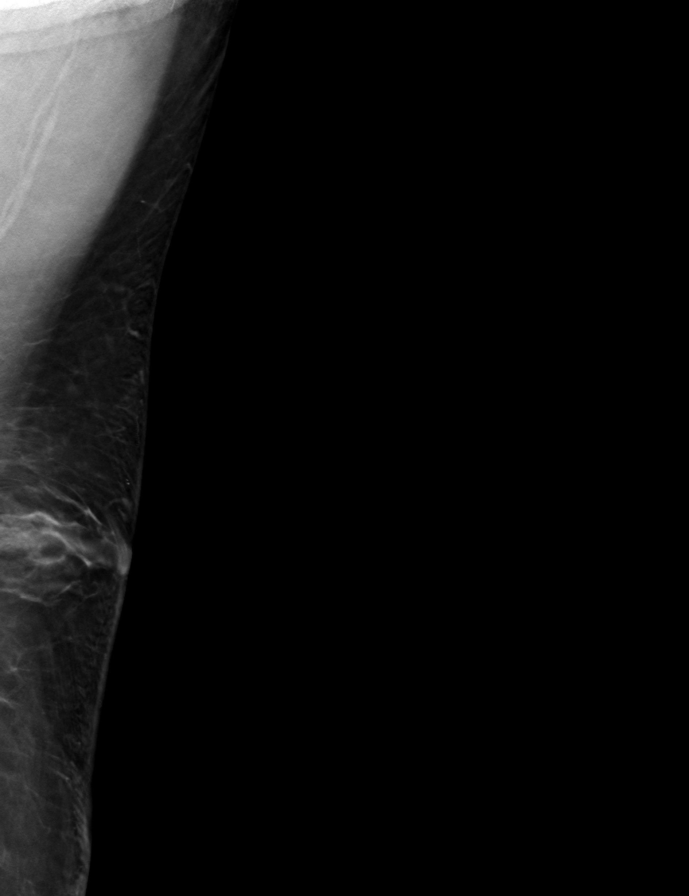
[frame 35/69]
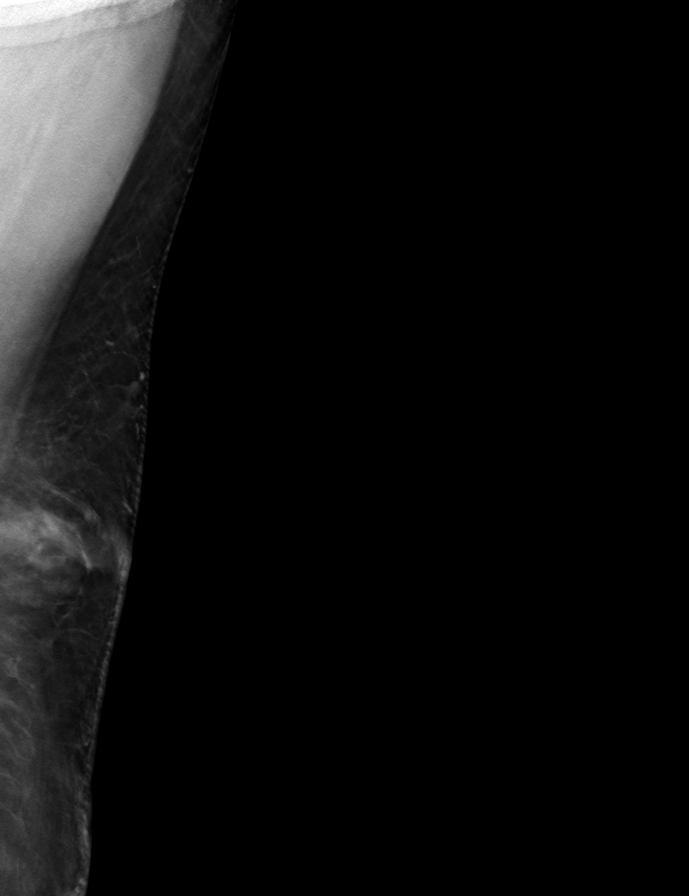

[R CC tomo · tomo slice 31/62.0]
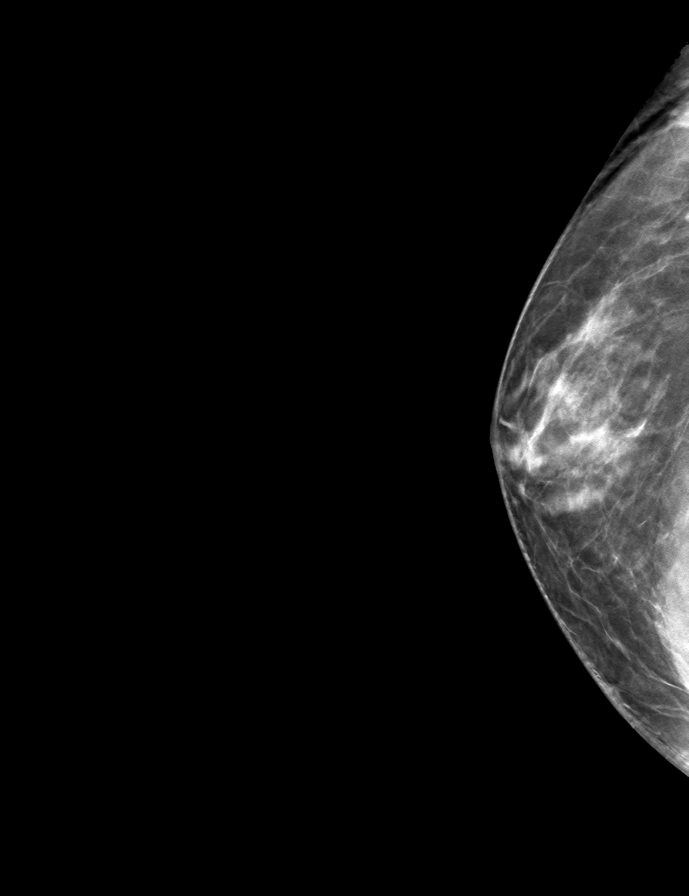

[L CC tomo · tomo slice 34/67.0]
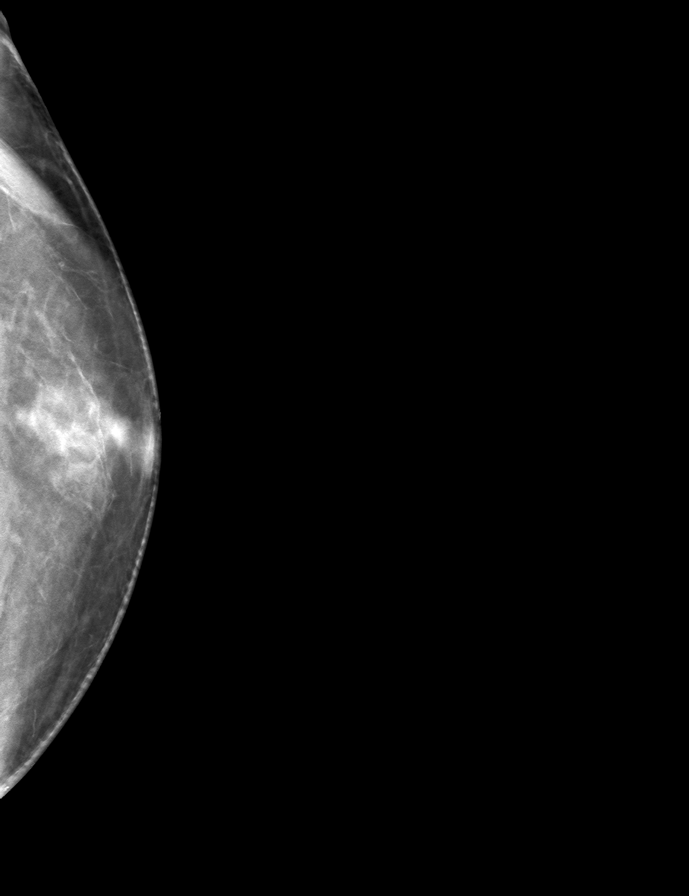

[R MLO tomo · tomo slice 32/63.0]
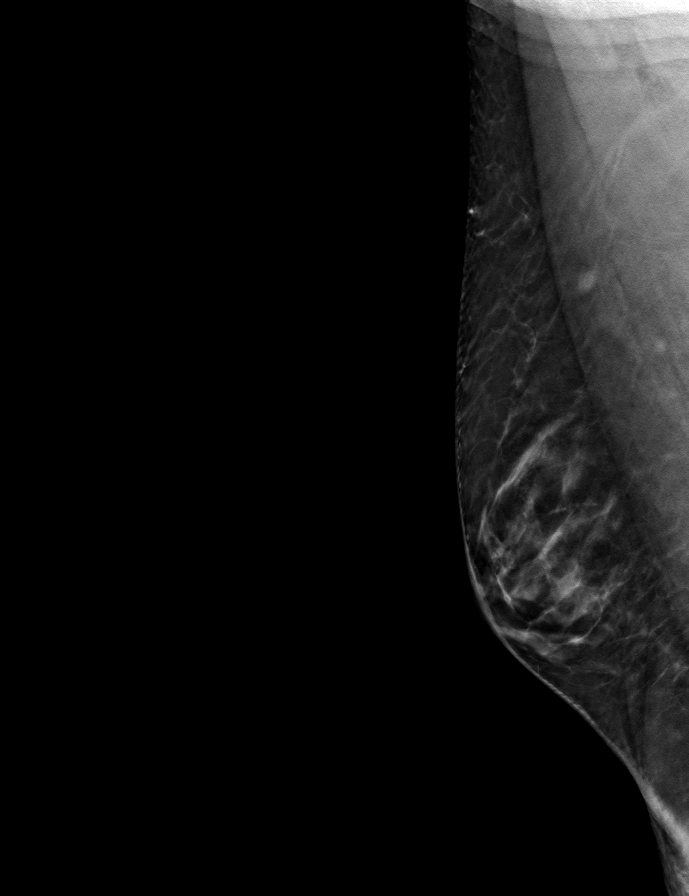

[9 of 24 positions shown; findings below may reference images not displayed]

FINDINGS: There is moderate bilateral flame shaped fibroglandular tissue
consistent with gynecomastia. It is slightly more pronounced on the
right than on the left. No suspicious calcifications, masses or
areas of distortion are seen in the bilateral breasts.
IMPRESSION: 1.  Right greater than left moderate benign gynecomastia.

2.  No mammographic evidence of malignancy in the bilateral breasts.

RECOMMENDATION:
1. Clinical follow-up recommended for symptomatic benign
gynecomastia.

I have discussed the findings and recommendations with the patient.
If applicable, a reminder letter will be sent to the patient
regarding the next appointment.

BI-RADS CATEGORY  2: Benign.

## 2022-10-20 ENCOUNTER — Ambulatory Visit: Payer: BC Managed Care – PPO | Admitting: Cardiovascular Disease

## 2022-10-26 ENCOUNTER — Other Ambulatory Visit: Payer: Self-pay | Admitting: Cardiovascular Disease

## 2022-10-26 DIAGNOSIS — I10 Essential (primary) hypertension: Secondary | ICD-10-CM

## 2022-10-26 DIAGNOSIS — I2510 Atherosclerotic heart disease of native coronary artery without angina pectoris: Secondary | ICD-10-CM

## 2022-10-27 ENCOUNTER — Ambulatory Visit: Payer: BC Managed Care – PPO | Admitting: Cardiovascular Disease

## 2022-10-27 ENCOUNTER — Encounter: Payer: Self-pay | Admitting: Cardiovascular Disease

## 2022-10-27 VITALS — BP 108/76 | HR 66 | Ht 75.0 in | Wt 221.8 lb

## 2022-10-27 DIAGNOSIS — I519 Heart disease, unspecified: Secondary | ICD-10-CM | POA: Diagnosis not present

## 2022-10-27 DIAGNOSIS — I2510 Atherosclerotic heart disease of native coronary artery without angina pectoris: Secondary | ICD-10-CM | POA: Diagnosis not present

## 2022-10-27 DIAGNOSIS — I10 Essential (primary) hypertension: Secondary | ICD-10-CM

## 2022-10-27 MED ORDER — LISINOPRIL 40 MG PO TABS: 40.0000 mg | ORAL_TABLET | Freq: Every day | ORAL | 1 refills | Status: DC

## 2022-10-27 NOTE — Assessment & Plan Note (Signed)
Grade I DD Echo 11/2020

## 2022-10-27 NOTE — Progress Notes (Signed)
Cardiology Office Note   Date:  10/27/2022   ID:  Bryan Shea, DOB Aug 05, 1955, MRN [id number]  PCP:  Malva Limes, MD  Cardiologist:  Adrian Blackwater, MD    History of Present Illness: Bryan Shea is a 67 y.o. male who presents for  Chief Complaint  Patient presents with   Follow-up    Follow up    Patient in office for routine cardiac exam. Denies chest pain, shortness of breath, edema, palpitations.     Past Medical History:  Diagnosis Date   Hepatitis C virus    History of chicken pox    Hypertension    Testicular cancer [facility])      Past Surgical History:  Procedure Laterality Date   CARDIAC CATHETERIZATION Right 09/28/2015   Procedure: Left Heart Cath and Coronary Angiography;  Surgeon: Laurier Nancy, MD;  Location: [facility] INVASIVE CV LAB;  Service: Cardiovascular;  Laterality: Right;   COLONOSCOPY WITH PROPOFOL N/A 04/29/2021   Procedure: COLONOSCOPY WITH PROPOFOL;  Surgeon: Regis Bill, MD;  Location: [facility] ENDOSCOPY;  Service: Endoscopy;  Laterality: N/A;   LIVER BIOPSY  11/11/2009   Chronic Hepatitis. Graed 3. Early stage 2.5% steatosis. Focal ironidentified in Kupffer cells   TESTICLE REMOVAL  1994   testicular mass excision     Current Outpatient Medications  Medication Sig Dispense Refill   aspirin 81 MG EC tablet Take 1 tablet (81 mg total) by mouth daily. 30 tablet 0   atorvastatin (LIPITOR) 40 MG tablet TAKE 1 TABLET(40 MG) BY MOUTH DAILY 90 tablet 0   carvedilol (COREG) 25 MG tablet Take 1 tablet (25 mg total) by mouth 2 (two) times daily with a meal. 90 tablet 1   Coenzyme Q10 300 MG CAPS Take 1 capsule by mouth daily.     hydrALAZINE (APRESOLINE) 25 MG tablet Take 100 mg by mouth daily.     Multiple Vitamin (MULTIVITAMIN) capsule Take 1 capsule by mouth daily.     Omega-3 Fatty Acids (FISH OIL) 1000 MG CAPS Take 1 capsule by mouth daily.     spironolactone (ALDACTONE) 25 MG tablet Take 1 tablet (25 mg total) by mouth daily. 90  tablet 1   lisinopril (ZESTRIL) 40 MG tablet Take 1 tablet (40 mg total) by mouth daily. 90 tablet 1   No current facility-administered medications for this visit.    Allergies:   Patient has no known allergies.    Social History:   reports that he has never smoked. He has never used smokeless tobacco. He reports current alcohol use. He reports that he does not use drugs.   Family History:  family history includes Breast cancer in his mother; Heart failure in his father.    ROS:     Review of Systems  Constitutional: Negative.   HENT: Negative.    Eyes: Negative.   Respiratory: Negative.    Cardiovascular: Negative.   Gastrointestinal: Negative.   Genitourinary: Negative.   Musculoskeletal: Negative.   Skin: Negative.   Neurological: Negative.   Endo/Heme/Allergies: Negative.   Psychiatric/Behavioral: Negative.    All other systems reviewed and are negative.   All other systems are reviewed and negative.   PHYSICAL EXAM: VS:  BP 108/76   Pulse 66   Ht 6\' 3"  (1.905 m)   Wt 221 lb 12.8 oz (100.6 kg)   SpO2 96%   BMI 27.72 kg/m  , BMI Body mass index is 27.72 kg/m. Last weight:  Wt Readings from Last 3  Encounters:  10/27/22 221 lb 12.8 oz (100.6 kg)  08/17/21 244 lb (110.7 kg)  04/29/21 243 lb (110.2 kg)   Physical Exam Vitals reviewed.  Constitutional:      Appearance: Normal appearance. He is normal weight.  HENT:     Head: Normocephalic.     Nose: Nose normal.     Mouth/Throat:     Mouth: Mucous membranes are moist.  Eyes:     Pupils: Pupils are equal, round, and reactive to light.  Cardiovascular:     Rate and Rhythm: Normal rate and regular rhythm.     Pulses: Normal pulses.     Heart sounds: Normal heart sounds.  Pulmonary:     Effort: Pulmonary effort is normal.  Abdominal:     General: Abdomen is flat. Bowel sounds are normal.  Musculoskeletal:        General: Normal range of motion.     Cervical back: Normal range of motion.  Skin:     General: Skin is warm.  Neurological:     General: No focal deficit present.     Mental Status: He is alert.  Psychiatric:        Mood and Affect: Mood normal.     EKG: none today  Recent Labs: No results found for requested labs within last 365 days.    Lipid Panel    Component Value Date/Time   CHOL 168 07/19/2020 0000   CHOL 101 05/24/2016 1406   TRIG 471 (A) 07/19/2020 0000   HDL 44 07/19/2020 0000   HDL 48 05/24/2016 1406   CHOLHDL 2.1 05/24/2016 1406   CHOLHDL 3.5 09/29/2015 0437   VLDL 49 (H) 09/29/2015 0437   LDLCALC 54 07/19/2020 0000   LDLCALC 26 05/24/2016 1406      Other studies Reviewed: Patient: [name] DOB:  04-05-1956  Date:  11/29/2020 15:00 Provider: Adrian Blackwater MD Encounter: ECHO   Page 2 REASON FOR VISIT  Visit for: Echocardiogram/Dilated Cardiomyopathy  Sex:   Male        wt=  255  lbs.  BP=132/84  Height=  76  inches.   TESTS  Imaging: Echocardiogram:  An echocardiogram in (2-d) mode was performed and in Doppler mode with color flow velocity mapping was performed. The aortic valve cusps are abnormal 2.7  cm, flow velocity 1.4   m/s, and systolic calculated mean flow gradient 4  mmHg. Mitral valve diastolic peak flow velocity E 0.8   m/s and E/A ratio 1.1. Aortic root diameter 4.2   cm. The LVOT internal diameter 2.2  cm and flow velocity was abnormal 0.9  m/s. LV systolic dimension 3.0   cm, diastolic 4.5  cm, posterior wall thickness 1.2    cm, fractional shortening 31 %, and EF 61%. IVS thickness 1.3    cm. LA dimension 3.7cm  RIGHT atrium=  14.9  cm2. Mitral Valve =  Ea=6.6  DT= 194 msec. Mitral Valve is Normal. Tricuspid Valve =  TR jet V=   2.3   RAP=5  RVSP= 27  mmHg. Aortic Valve is Normal. Pulmonic Valve= PIEDV=   0.9   m/s. Pulmonic Valve has Mild Regurgitation. Tricuspid Valve has Mild Regurgitation.    ASSESSMENT  Technically adequate study.  Ejection fraction-61  Left Ventricle- Normal  size   Left Ventricle diastolic dysfunction grade-Grade 1 relaxation abnormality  Right Ventricle- Mildly dilated normal function  Normal right ventricular wall motion  Left Atrium-Mildly dilated  Right Atrium-Normal size  Aortic valve-No  stenosis, No regurgitation  Pulmonic Valve-No stenosis, Mild regurgitation  Mitral Valve-No regurgitation  Tricuspid Valve-Mild Regurgitation  No Pericardial effusion.   THERAPY   Referring physician: Laurier Nancy  Sonographer: Lenor Derrick.   Adrian Blackwater MD  Electronically signed by: Adrian Blackwater     Date: 11/30/2020 09:18  Patient: [id number] - Bryan Shea DOB:  30-Dec-1955  Date:  09/27/2018 10:30 Provider: Adrian Blackwater MD Encounter: ALL ANGIOGRAMS (CTA BRAIN, CAROTIDS, RENAL ARTERIES, PE)   Page 2 REASON FOR VISIT  Referred by Dr.Namrata Dangler [place] Flakes.   TESTS  Imaging: Computed Tomographic Angiography:  Cardiac multidetector CT was performed paying particular attention to the coronary arteries for the diagnosis of: Chest pain. Diagnostic Drugs:  Administered iohexol (Omnipaque) through an antecubital vein and images from the examination were analyzed for the presence and extent of coronary artery disease, using 3D image processing software. 100 mL of non-ionic contrast (Omnipaque) was used.   TEST CONCLUSIONS  Quality of study: Excellent.  1-Calcium score: 124.7  2-Right dominant system.   3-Normal coronaries.   Adrian Blackwater MD  Electronically signed by: Adrian Blackwater     Date: 09/30/2018 13:40  Patient: [id number] - Bryan Shea DOB:  25-May-1956  Date:  09/16/2018 13:45 Provider: Adrian Blackwater MD Encounter: [facility] TEST   Page 2 TESTS                                                                                          [facility] [address] [place], [place] [address] [phone] STUDY:  Gated Stress / Rest Myocardial Perfusion With Wall Motion, Left Ventricular Ejection  Fraction.Treadmill Stress Test. SEX:      Male                           WEIGHT:  244lbs                   HEIGHT: 75 in                                                                                                 ARMS UP: YES/NO                                                                        REFERRING PHYSICIAN:  Dr.Riverlyn Kizziah [place] Flakes  INDICATION FOR STUDY:  Heart Failure                                                                                                                                                                                                                    TECHNIQUE:  Approximately 20 minutes following the intravenous administration of 13.1 mCi of Tc-65m Sestamibi after stress testing in a reclined supine position with arms above their head if able to do so, gated SPECT imaging of the heart was performed. After about a 2hr break, the patient was injected intravenously with 33.0 mCi of Tc-81m Sestamibi.  Approximately 45 minutes later in the same position as stress imaging SPECT rest imaging of the heart was performed.  STRESS BY:  Adrian Blackwater, MD PROTOCOL:   Smitty Cords                                                                                      MAX PRED HR: 159                     85%:  135              75%:  119                                                                                                                  RESTING BP:  160/112    RESTING HR: 101    PEAK BP: 230/130      PEAK HR: 155  EXERCISE DURATION: 3:46                                             METS:  5.5    REASON FOR TEST TERMINATION: Target reached/1 min post injection                                                                                                                                   SYMPTOMS: None  [facility] TREADMILL SCORE:  4                                      RISK:  Moderate                                                                                                                                                                                                           EKG RESULTS: NSR. 86/min. T-wave inversion at baseline in lateral leads, no significant ST changes at peak exercise.  PERFUSION/WALL MOTION FINDINGS: EF = 51%. Moderate size and intensity inferior and apical wall reversible defects, mild inferior hypokinesis.                                                                           IMPRESSION:  Equivocal stress test with low normal LVEF, consider CCTA.                                                                                                                                                                                                                                                                                        Adrian Blackwater, MD Stress Interpreting Physician / Nuclear Interpreting Physician  Adrian Blackwater MD  Electronically signed by: Adrian Blackwater     Date: 09/23/2018 13:41   ASSESSMENT AND PLAN:    ICD-10-CM   1. Essential hypertension  I10 lisinopril (ZESTRIL) 40 MG tablet    2. Coronary artery disease involving native coronary artery of native heart without angina pectoris  I25.10     3. Impaired left ventricular relaxation  I51.9        Problem List Items Addressed This Visit        Cardiovascular and Mediastinum   CAD (coronary artery disease)    09/2018 CCTA 09/27/18: Quality of study: Excellent.  1-Calcium score: 124.7  2-Right dominant system.   3-Normal coronaries.      Relevant Medications   lisinopril (ZESTRIL) 40 MG tablet   Essential hypertension - Primary    Well controlled. EF 61% on echo 11/2020      Relevant Medications   lisinopril (ZESTRIL) 40 MG tablet   Impaired left ventricular relaxation    Grade I DD Echo 11/2020      Relevant Medications   lisinopril (ZESTRIL) 40 MG tablet     Disposition:   Return in about 6 months (around 04/29/2023).  Total time spent: 30 minutes  Signed,  Adrian Blackwater, MD  10/27/2022 9:49 AM    [facility]

## 2022-10-27 NOTE — Assessment & Plan Note (Signed)
Well controlled. EF 61% on echo 11/2020

## 2022-10-27 NOTE — Assessment & Plan Note (Signed)
09/2018 CCTA 09/27/18: Quality of study: Excellent.  1-Calcium score: 124.7  2-Right dominant system.   3-Normal coronaries.

## 2023-04-23 DIAGNOSIS — H43811 Vitreous degeneration, right eye: Secondary | ICD-10-CM | POA: Diagnosis not present

## 2023-04-23 DIAGNOSIS — H43393 Other vitreous opacities, bilateral: Secondary | ICD-10-CM | POA: Diagnosis not present

## 2023-04-23 DIAGNOSIS — H02882 Meibomian gland dysfunction right lower eyelid: Secondary | ICD-10-CM | POA: Diagnosis not present

## 2023-04-23 DIAGNOSIS — H5213 Myopia, bilateral: Secondary | ICD-10-CM | POA: Diagnosis not present

## 2023-04-23 DIAGNOSIS — H02889 Meibomian gland dysfunction of unspecified eye, unspecified eyelid: Secondary | ICD-10-CM | POA: Diagnosis not present

## 2023-06-16 ENCOUNTER — Other Ambulatory Visit: Payer: Self-pay | Admitting: Cardiovascular Disease

## 2023-06-16 DIAGNOSIS — I10 Essential (primary) hypertension: Secondary | ICD-10-CM

## 2023-06-16 DIAGNOSIS — I2510 Atherosclerotic heart disease of native coronary artery without angina pectoris: Secondary | ICD-10-CM

## 2023-06-21 ENCOUNTER — Other Ambulatory Visit: Payer: Self-pay

## 2023-06-21 DIAGNOSIS — I10 Essential (primary) hypertension: Secondary | ICD-10-CM

## 2023-06-21 DIAGNOSIS — I2510 Atherosclerotic heart disease of native coronary artery without angina pectoris: Secondary | ICD-10-CM

## 2023-06-21 MED ORDER — ATORVASTATIN CALCIUM 40 MG PO TABS: 40.0000 mg | ORAL_TABLET | Freq: Every day | ORAL | 3 refills | Status: DC

## 2023-08-22 DIAGNOSIS — R262 Difficulty in walking, not elsewhere classified: Secondary | ICD-10-CM | POA: Diagnosis not present

## 2023-08-22 DIAGNOSIS — M170 Bilateral primary osteoarthritis of knee: Secondary | ICD-10-CM | POA: Diagnosis not present

## 2023-08-22 DIAGNOSIS — M25562 Pain in left knee: Secondary | ICD-10-CM | POA: Diagnosis not present

## 2023-08-22 DIAGNOSIS — M25561 Pain in right knee: Secondary | ICD-10-CM | POA: Diagnosis not present

## 2023-08-31 ENCOUNTER — Ambulatory Visit: Payer: BC Managed Care – PPO | Admitting: Cardiovascular Disease

## 2023-08-31 ENCOUNTER — Encounter: Payer: Self-pay | Admitting: Cardiovascular Disease

## 2023-08-31 VITALS — BP 152/89 | HR 104 | Ht 75.0 in | Wt 220.0 lb

## 2023-08-31 DIAGNOSIS — I5033 Acute on chronic diastolic (congestive) heart failure: Secondary | ICD-10-CM | POA: Diagnosis not present

## 2023-08-31 DIAGNOSIS — I10 Essential (primary) hypertension: Secondary | ICD-10-CM | POA: Diagnosis not present

## 2023-08-31 DIAGNOSIS — R0602 Shortness of breath: Secondary | ICD-10-CM | POA: Diagnosis not present

## 2023-08-31 DIAGNOSIS — I2510 Atherosclerotic heart disease of native coronary artery without angina pectoris: Secondary | ICD-10-CM

## 2023-08-31 DIAGNOSIS — I519 Heart disease, unspecified: Secondary | ICD-10-CM | POA: Diagnosis not present

## 2023-08-31 MED ORDER — SPIRONOLACTONE 25 MG PO TABS: 25.0000 mg | ORAL_TABLET | Freq: Every day | ORAL | 1 refills | Status: DC

## 2023-08-31 MED ORDER — LISINOPRIL 40 MG PO TABS: 40.0000 mg | ORAL_TABLET | Freq: Every day | ORAL | 1 refills | Status: DC

## 2023-08-31 MED ORDER — CARVEDILOL 25 MG PO TABS: 25.0000 mg | ORAL_TABLET | Freq: Two times a day (BID) | ORAL | 1 refills | Status: DC

## 2023-08-31 MED ORDER — HYDRALAZINE HCL 25 MG PO TABS: 100.0000 mg | ORAL_TABLET | Freq: Two times a day (BID) | ORAL | 3 refills | Status: AC

## 2023-08-31 NOTE — Progress Notes (Signed)
 Cardiology Office Note   Date:  08/31/2023   ID:  Bryan Shea, DOB 09-24-1955, MRN [id number]  PCP:  Malva Limes, MD  Cardiologist:  Adrian Blackwater, MD      History of Present Illness: Bryan Shea is a 68 y.o. male who presents for  Chief Complaint  Patient presents with   Follow-up    Feels fine and has knee issues, not taking coreg and aldactone as ran out that's why HR/BP high      Past Medical History:  Diagnosis Date   Hepatitis C virus    History of chicken pox    Hypertension    Testicular cancer [facility])      Past Surgical History:  Procedure Laterality Date   CARDIAC CATHETERIZATION Right 09/28/2015   Procedure: Left Heart Cath and Coronary Angiography;  Surgeon: Laurier Nancy, MD;  Location: [facility] INVASIVE CV LAB;  Service: Cardiovascular;  Laterality: Right;   COLONOSCOPY WITH PROPOFOL N/A 04/29/2021   Procedure: COLONOSCOPY WITH PROPOFOL;  Surgeon: Regis Bill, MD;  Location: [facility] ENDOSCOPY;  Service: Endoscopy;  Laterality: N/A;   LIVER BIOPSY  11/11/2009   Chronic Hepatitis. Graed 3. Early stage 2.5% steatosis. Focal ironidentified in Kupffer cells   TESTICLE REMOVAL  1994   testicular mass excision     Current Outpatient Medications  Medication Sig Dispense Refill   aspirin 81 MG EC tablet Take 1 tablet (81 mg total) by mouth daily. 30 tablet 0   atorvastatin (LIPITOR) 40 MG tablet Take 1 tablet (40 mg total) by mouth daily. 60 tablet 3   Multiple Vitamin (MULTIVITAMIN) capsule Take 1 capsule by mouth daily.     Omega-3 Fatty Acids (FISH OIL) 1000 MG CAPS Take 1 capsule by mouth daily.     carvedilol (COREG) 25 MG tablet Take 1 tablet (25 mg total) by mouth 2 (two) times daily with a meal. 90 tablet 1   Coenzyme Q10 300 MG CAPS Take 1 capsule by mouth daily.     hydrALAZINE (APRESOLINE) 25 MG tablet Take 4 tablets (100 mg total) by mouth 2 (two) times daily. 60 tablet 3   lisinopril (ZESTRIL) 40 MG tablet Take 1 tablet (40 mg  total) by mouth daily. 90 tablet 1   spironolactone (ALDACTONE) 25 MG tablet Take 1 tablet (25 mg total) by mouth daily. 90 tablet 1   No current facility-administered medications for this visit.    Allergies:   Patient has no known allergies.    Social History:   reports that he has never smoked. He has never used smokeless tobacco. He reports current alcohol use. He reports that he does not use drugs.   Family History:  family history includes Breast cancer in his mother; Heart failure in his father.    ROS:     Review of Systems  Constitutional: Negative.   HENT: Negative.    Eyes: Negative.   Respiratory: Negative.    Gastrointestinal: Negative.   Genitourinary: Negative.   Musculoskeletal: Negative.   Skin: Negative.   Neurological: Negative.   Endo/Heme/Allergies: Negative.   Psychiatric/Behavioral: Negative.    All other systems reviewed and are negative.     All other systems are reviewed and negative.    PHYSICAL EXAM: VS:  BP (!) 152/89   Pulse (!) 104   Ht 6\' 3"  (1.905 m)   Wt 220 lb (99.8 kg)   SpO2 97%   BMI 27.50 kg/m  , BMI Body mass index is 27.5  kg/m. Last weight:  Wt Readings from Last 3 Encounters:  08/31/23 220 lb (99.8 kg)  10/27/22 221 lb 12.8 oz (100.6 kg)  08/17/21 244 lb (110.7 kg)     Physical Exam Vitals reviewed.  Constitutional:      Appearance: Normal appearance. He is normal weight.  HENT:     Head: Normocephalic.     Nose: Nose normal.     Mouth/Throat:     Mouth: Mucous membranes are moist.  Eyes:     Pupils: Pupils are equal, round, and reactive to light.  Cardiovascular:     Rate and Rhythm: Normal rate and regular rhythm.     Pulses: Normal pulses.     Heart sounds: Normal heart sounds.  Pulmonary:     Effort: Pulmonary effort is normal.  Abdominal:     General: Abdomen is flat. Bowel sounds are normal.  Musculoskeletal:        General: Normal range of motion.     Cervical back: Normal range of motion.   Skin:    General: Skin is warm.  Neurological:     General: No focal deficit present.     Mental Status: He is alert.  Psychiatric:        Mood and Affect: Mood normal.       EKG:   Recent Labs: No results found for requested labs within last 365 days.    Lipid Panel    Component Value Date/Time   CHOL 168 07/19/2020 0000   CHOL 101 05/24/2016 1406   TRIG 471 (A) 07/19/2020 0000   HDL 44 07/19/2020 0000   HDL 48 05/24/2016 1406   CHOLHDL 2.1 05/24/2016 1406   CHOLHDL 3.5 09/29/2015 0437   VLDL 49 (H) 09/29/2015 0437   LDLCALC 54 07/19/2020 0000   LDLCALC 26 05/24/2016 1406      Other studies Reviewed: Additional studies/ records that were reviewed today include:  Review of the above records demonstrates:       No data to display            ASSESSMENT AND PLAN:    ICD-10-CM   1. Coronary artery disease involving native coronary artery of native heart without angina pectoris  I25.10 spironolactone (ALDACTONE) 25 MG tablet    carvedilol (COREG) 25 MG tablet    lisinopril (ZESTRIL) 40 MG tablet    hydrALAZINE (APRESOLINE) 25 MG tablet    PCV ECHOCARDIOGRAM COMPLETE    MYOCARDIAL PERFUSION IMAGING    2. Essential hypertension  I10 spironolactone (ALDACTONE) 25 MG tablet    carvedilol (COREG) 25 MG tablet    lisinopril (ZESTRIL) 40 MG tablet    hydrALAZINE (APRESOLINE) 25 MG tablet    PCV ECHOCARDIOGRAM COMPLETE    MYOCARDIAL PERFUSION IMAGING    3. Impaired left ventricular relaxation  I51.9 spironolactone (ALDACTONE) 25 MG tablet    carvedilol (COREG) 25 MG tablet    lisinopril (ZESTRIL) 40 MG tablet    hydrALAZINE (APRESOLINE) 25 MG tablet    PCV ECHOCARDIOGRAM COMPLETE    MYOCARDIAL PERFUSION IMAGING    4. SOB (shortness of breath)  R06.02 PCV ECHOCARDIOGRAM COMPLETE    MYOCARDIAL PERFUSION IMAGING   has not had echo, stress test for a while more than 4 years, advise getting ischaemia evaluation and LV function    5. CHF (congestive heart  failure), NYHA class III, acute on chronic, diastolic (HCC)  I50.33 PCV ECHOCARDIOGRAM COMPLETE    MYOCARDIAL PERFUSION IMAGING       Problem List  Items Addressed This Visit       Cardiovascular and Mediastinum   CAD (coronary artery disease) - Primary   Relevant Medications   spironolactone (ALDACTONE) 25 MG tablet   carvedilol (COREG) 25 MG tablet   lisinopril (ZESTRIL) 40 MG tablet   hydrALAZINE (APRESOLINE) 25 MG tablet   Other Relevant Orders   PCV ECHOCARDIOGRAM COMPLETE   MYOCARDIAL PERFUSION IMAGING   Essential hypertension   Relevant Medications   spironolactone (ALDACTONE) 25 MG tablet   carvedilol (COREG) 25 MG tablet   lisinopril (ZESTRIL) 40 MG tablet   hydrALAZINE (APRESOLINE) 25 MG tablet   Other Relevant Orders   PCV ECHOCARDIOGRAM COMPLETE   MYOCARDIAL PERFUSION IMAGING   Impaired left ventricular relaxation   Relevant Medications   spironolactone (ALDACTONE) 25 MG tablet   carvedilol (COREG) 25 MG tablet   lisinopril (ZESTRIL) 40 MG tablet   hydrALAZINE (APRESOLINE) 25 MG tablet   Other Relevant Orders   PCV ECHOCARDIOGRAM COMPLETE   MYOCARDIAL PERFUSION IMAGING   Other Visit Diagnoses       SOB (shortness of breath)       has not had echo, stress test for a while more than 4 years, advise getting ischaemia evaluation and LV function   Relevant Orders   PCV ECHOCARDIOGRAM COMPLETE   MYOCARDIAL PERFUSION IMAGING     CHF (congestive heart failure), NYHA class III, acute on chronic, diastolic (HCC)       Relevant Medications   spironolactone (ALDACTONE) 25 MG tablet   carvedilol (COREG) 25 MG tablet   lisinopril (ZESTRIL) 40 MG tablet   hydrALAZINE (APRESOLINE) 25 MG tablet   Other Relevant Orders   PCV ECHOCARDIOGRAM COMPLETE   MYOCARDIAL PERFUSION IMAGING          Disposition:   Return in about 4 weeks (around 09/28/2023) for echo, stress test and f/u.    Total time spent: 30 minutes  Signed,  Adrian Blackwater, MD  08/31/2023 3:13 PM     [facility]

## 2023-09-10 ENCOUNTER — Encounter

## 2023-09-12 DIAGNOSIS — M170 Bilateral primary osteoarthritis of knee: Secondary | ICD-10-CM | POA: Diagnosis not present

## 2023-09-12 DIAGNOSIS — M1711 Unilateral primary osteoarthritis, right knee: Secondary | ICD-10-CM | POA: Diagnosis not present

## 2023-09-12 DIAGNOSIS — R262 Difficulty in walking, not elsewhere classified: Secondary | ICD-10-CM | POA: Diagnosis not present

## 2023-09-12 DIAGNOSIS — M25661 Stiffness of right knee, not elsewhere classified: Secondary | ICD-10-CM | POA: Diagnosis not present

## 2023-09-12 DIAGNOSIS — M25662 Stiffness of left knee, not elsewhere classified: Secondary | ICD-10-CM | POA: Diagnosis not present

## 2023-09-12 DIAGNOSIS — M1712 Unilateral primary osteoarthritis, left knee: Secondary | ICD-10-CM | POA: Diagnosis not present

## 2023-09-12 DIAGNOSIS — M25561 Pain in right knee: Secondary | ICD-10-CM | POA: Diagnosis not present

## 2023-09-12 DIAGNOSIS — M25562 Pain in left knee: Secondary | ICD-10-CM | POA: Diagnosis not present

## 2023-09-21 DIAGNOSIS — M25561 Pain in right knee: Secondary | ICD-10-CM | POA: Diagnosis not present

## 2023-09-21 DIAGNOSIS — R262 Difficulty in walking, not elsewhere classified: Secondary | ICD-10-CM | POA: Diagnosis not present

## 2023-09-21 DIAGNOSIS — M25562 Pain in left knee: Secondary | ICD-10-CM | POA: Diagnosis not present

## 2023-09-21 DIAGNOSIS — M170 Bilateral primary osteoarthritis of knee: Secondary | ICD-10-CM | POA: Diagnosis not present

## 2023-09-28 ENCOUNTER — Other Ambulatory Visit

## 2023-10-02 ENCOUNTER — Ambulatory Visit: Admitting: Cardiovascular Disease

## 2023-10-12 DIAGNOSIS — M25561 Pain in right knee: Secondary | ICD-10-CM | POA: Diagnosis not present

## 2023-10-12 DIAGNOSIS — M25562 Pain in left knee: Secondary | ICD-10-CM | POA: Diagnosis not present

## 2023-10-12 DIAGNOSIS — M170 Bilateral primary osteoarthritis of knee: Secondary | ICD-10-CM | POA: Diagnosis not present

## 2023-10-12 DIAGNOSIS — R262 Difficulty in walking, not elsewhere classified: Secondary | ICD-10-CM | POA: Diagnosis not present

## 2023-10-29 ENCOUNTER — Ambulatory Visit: Admitting: Family Medicine

## 2023-10-29 ENCOUNTER — Encounter: Payer: Self-pay | Admitting: Family Medicine

## 2023-10-29 VITALS — BP 98/58 | HR 68 | Temp 98.1°F | Resp 16 | Ht 75.0 in | Wt 229.0 lb

## 2023-10-29 DIAGNOSIS — Z23 Encounter for immunization: Secondary | ICD-10-CM

## 2023-10-29 DIAGNOSIS — I10 Essential (primary) hypertension: Secondary | ICD-10-CM

## 2023-10-29 DIAGNOSIS — I2510 Atherosclerotic heart disease of native coronary artery without angina pectoris: Secondary | ICD-10-CM

## 2023-10-29 DIAGNOSIS — I517 Cardiomegaly: Secondary | ICD-10-CM | POA: Diagnosis not present

## 2023-10-29 DIAGNOSIS — Z125 Encounter for screening for malignant neoplasm of prostate: Secondary | ICD-10-CM

## 2023-10-29 DIAGNOSIS — M170 Bilateral primary osteoarthritis of knee: Secondary | ICD-10-CM | POA: Diagnosis not present

## 2023-10-29 NOTE — Progress Notes (Signed)
 Subjective:    Bryan Shea is a 68 y.o. male who presents for a Welcome to Medicare exam.     He feels well, has no complaints. He retired in September and went on Medicare C at that time. Is considering starting some part time work. Sees Dr. Mason Sole for CAD and diastolic dysfunction a few times a year. Tolerating current medications. Rarely drinks alcohol. Has never smoked.     Objective:    Today's Vitals   10/29/23 0852  BP: (!) 98/58  Pulse: 68  Resp: 16  Temp: 98.1 F (36.7 C)  TempSrc: Oral  SpO2: 98%  Weight: 229 lb (103.9 kg)  Height: 6\' 3"  (1.905 m)   Body mass index is 28.62 kg/m.  Medications Outpatient Encounter Medications as of 10/29/2023  Medication Sig   aspirin  81 MG EC tablet Take 1 tablet (81 mg total) by mouth daily.   atorvastatin  (LIPITOR) 40 MG tablet Take 1 tablet (40 mg total) by mouth daily.   carvedilol  (COREG ) 25 MG tablet Take 1 tablet (25 mg total) by mouth 2 (two) times daily with a meal.   Coenzyme Q10 300 MG CAPS Take 1 capsule by mouth daily.   hydrALAZINE  (APRESOLINE ) 25 MG tablet Take 4 tablets (100 mg total) by mouth 2 (two) times daily.   lisinopril  (ZESTRIL ) 40 MG tablet Take 1 tablet (40 mg total) by mouth daily.   Multiple Vitamin (MULTIVITAMIN) capsule Take 1 capsule by mouth daily.   Omega-3 Fatty Acids (FISH OIL) 1000 MG CAPS Take 1 capsule by mouth daily.   spironolactone  (ALDACTONE ) 25 MG tablet Take 1 tablet (25 mg total) by mouth daily.   No facility-administered encounter medications on file as of 10/29/2023.     History: Past Medical History:  Diagnosis Date   Hepatitis C virus    History of chicken pox    Hypertension    Testicular cancer [facility])    Past Surgical History:  Procedure Laterality Date   CARDIAC CATHETERIZATION Right 09/28/2015   Procedure: Left Heart Cath and Coronary Angiography;  Surgeon: Cherrie Cornwall, MD;  Location: [facility] INVASIVE CV LAB;  Service: Cardiovascular;  Laterality: Right;   COLONOSCOPY  WITH PROPOFOL  N/A 04/29/2021   Procedure: COLONOSCOPY WITH PROPOFOL ;  Surgeon: Shane Darling, MD;  Location: [facility] ENDOSCOPY;  Service: Endoscopy;  Laterality: N/A;   LIVER BIOPSY  11/11/2009   Chronic Hepatitis. Graed 3. Early stage 2.5% steatosis. Focal ironidentified in Kupffer cells   TESTICLE REMOVAL  1994   testicular mass excision    Family History  Problem Relation Age of Onset   Heart failure Father        deceased   Breast cancer Mother    Heart attack Neg Hx    Diabetes Neg Hx    Social History   Occupational History   Occupation: Works for [employer]  Tobacco Use   Smoking status: Never   Smokeless tobacco: Never  Substance and Sexual Activity   Alcohol use: Yes    Comment: drinks once a month or less   Drug use: No   Sexual activity: Not on file    Tobacco Counseling Counseling given: Not Answered   Immunizations and Health Maintenance Immunization History  Administered Date(s) Administered   Influenza,inj,Quad PF,6+ Mos 08/20/2020   PFIZER(Purple Top)SARS-COV-2 Vaccination 08/31/2019, 09/23/2019, 03/29/2020   PNEUMOCOCCAL CONJUGATE-20 10/29/2023   Tdap 02/03/2013   Health Maintenance Due  Topic Date Due   Medicare Annual Wellness (AWV)  Never done  Zoster Vaccines- Shingrix (1 of 2) Never done   DTaP/Tdap/Td (2 - Td or Tdap) 02/04/2023   COVID-19 Vaccine (4 - 2024-25 season) 02/25/2023    Activities of Daily Living    10/29/2023    8:56 AM  In your present state of health, do you have any difficulty performing the following activities:  Hearing? 0  Vision? 0  Difficulty concentrating or making decisions? 0  Walking or climbing stairs? 1  Comment because of knees (arthritis)  Dressing or bathing? 0  Doing errands, shopping? 0     General: Appearance:    Well developed, well nourished male in no acute distress  Eyes:    PERRL, conjunctiva/corneas clear, EOM's intact       Lungs:     Clear to auscultation bilaterally,  respirations unlabored  Heart:    Normal heart rate. Normal rhythm. No murmurs, rubs, or gallops.    MS:   All extremities are intact.    Neurologic:   Awake, alert, oriented x 3. No apparent focal neurological defect.         Advanced Directives:     EKG:  normal EKG, normal sinus rhythm, unchanged from previous tracings     Assessment:    This is a routine wellness  examination for this patient . Doing well on current medications managed by Dr. Mason Sole for CAD & diastolic dysfunction.   Vision/Hearing screen Vision Screening - Comments:: March 2024-Dr.Ivy  [facility]   Goals   None      Depression Screen    10/29/2023    8:52 AM 08/20/2020    3:56 PM  PHQ 2/9 Scores  PHQ - 2 Score 0 0  PHQ- 9 Score  0     Fall Risk    10/29/2023    8:52 AM  Fall Risk   Falls in the past year? 0  Number falls in past yr: 0  Injury with Fall? 0  Risk for fall due to : No Fall Risks    Cognitive Function        10/29/2023    8:57 AM  6CIT Screen  What Year? 0 points  What month? 0 points  What time? 0 points  Count back from 20 0 points  Months in reverse 0 points  Repeat phrase 0 points  Total Score 0 points    Patient Care Team: Lamon Pillow, MD as PCP - General (Family Medicine) Cherrie Cornwall, MD as Consulting Physician (Cardiology) Marlynn Singer, MD (Orthopedic Surgery) Emerick Hanlon Leanora Prophet, MD as Consulting Physician (Gastroenterology)     Plan:    Check CBC, Met C, PSA, Lipids.   Given Prevnar-20 today.   I have personally reviewed and noted the following in the patient's chart:   Medical and social history Use of alcohol, tobacco or illicit drugs  Current medications and supplements including opioid prescriptions. Patient is not currently taking opioid prescriptions. Functional ability and status Nutritional status Physical activity Advanced directives List of other physicians Hospitalizations, surgeries, and ER visits in previous 12  months Vitals Screenings to include cognitive, depression, and falls Referrals and appointments  In addition, I have reviewed and discussed with patient certain preventive protocols, quality metrics, and best practice recommendations. A written personalized care plan for preventive services as well as general preventive health recommendations were provided to patient.     Jeralene Mom, MD 10/29/2023

## 2023-10-30 ENCOUNTER — Encounter: Payer: Self-pay | Admitting: Family Medicine

## 2023-10-30 LAB — COMPREHENSIVE METABOLIC PANEL WITH GFR
ALT: 19 IU/L (ref 0–44)
AST: 18 IU/L (ref 0–40)
Albumin: 4.6 g/dL (ref 3.9–4.9)
Alkaline Phosphatase: 96 IU/L (ref 44–121)
BUN/Creatinine Ratio: 18 (ref 10–24)
BUN: 25 mg/dL (ref 8–27)
Bilirubin Total: 2.3 mg/dL — ABNORMAL HIGH (ref 0.0–1.2)
CO2: 20 mmol/L (ref 20–29)
Calcium: 9.6 mg/dL (ref 8.6–10.2)
Chloride: 105 mmol/L (ref 96–106)
Creatinine, Ser: 1.36 mg/dL — ABNORMAL HIGH (ref 0.76–1.27)
Globulin, Total: 2.4 g/dL (ref 1.5–4.5)
Glucose: 116 mg/dL — ABNORMAL HIGH (ref 70–99)
Potassium: 5 mmol/L (ref 3.5–5.2)
Sodium: 140 mmol/L (ref 134–144)
Total Protein: 7 g/dL (ref 6.0–8.5)
eGFR: 57 mL/min/{1.73_m2} — ABNORMAL LOW (ref >59)

## 2023-10-30 LAB — LIPID PANEL
Chol/HDL Ratio: 3.1 ratio (ref 0.0–5.0)
Cholesterol, Total: 110 mg/dL (ref 100–199)
HDL: 36 mg/dL — ABNORMAL LOW (ref >39)
LDL Chol Calc (NIH): 48 mg/dL (ref 0–99)
Triglycerides: 152 mg/dL — ABNORMAL HIGH (ref 0–149)
VLDL Cholesterol Cal: 26 mg/dL (ref 5–40)

## 2023-10-30 LAB — CBC
Hematocrit: 37.1 % — ABNORMAL LOW (ref 37.5–51.0)
Hemoglobin: 12.6 g/dL — ABNORMAL LOW (ref 13.0–17.7)
MCH: 32 pg (ref 26.6–33.0)
MCHC: 34 g/dL (ref 31.5–35.7)
MCV: 94 fL (ref 79–97)
Platelets: 204 10*3/uL (ref 150–450)
RBC: 3.94 x10E6/uL — ABNORMAL LOW (ref 4.14–5.80)
RDW: 12.2 % (ref 11.6–15.4)
WBC: 7.6 10*3/uL (ref 3.4–10.8)

## 2023-10-30 LAB — PSA TOTAL (REFLEX TO FREE): Prostate Specific Ag, Serum: 0.7 ng/mL (ref 0.0–4.0)

## 2023-11-25 ENCOUNTER — Other Ambulatory Visit: Payer: Self-pay | Admitting: Cardiovascular Disease

## 2023-11-25 DIAGNOSIS — I2510 Atherosclerotic heart disease of native coronary artery without angina pectoris: Secondary | ICD-10-CM

## 2023-11-25 DIAGNOSIS — I10 Essential (primary) hypertension: Secondary | ICD-10-CM

## 2023-11-25 DIAGNOSIS — I519 Heart disease, unspecified: Secondary | ICD-10-CM

## 2023-11-26 ENCOUNTER — Other Ambulatory Visit: Payer: Self-pay

## 2023-11-26 ENCOUNTER — Other Ambulatory Visit: Payer: Self-pay | Admitting: Cardiovascular Disease

## 2023-11-26 DIAGNOSIS — R0602 Shortness of breath: Secondary | ICD-10-CM

## 2023-11-26 DIAGNOSIS — I10 Essential (primary) hypertension: Secondary | ICD-10-CM

## 2023-11-26 DIAGNOSIS — I519 Heart disease, unspecified: Secondary | ICD-10-CM

## 2023-11-26 DIAGNOSIS — I2510 Atherosclerotic heart disease of native coronary artery without angina pectoris: Secondary | ICD-10-CM

## 2023-11-26 DIAGNOSIS — I5033 Acute on chronic diastolic (congestive) heart failure: Secondary | ICD-10-CM

## 2023-12-19 ENCOUNTER — Ambulatory Visit (INDEPENDENT_AMBULATORY_CARE_PROVIDER_SITE_OTHER)

## 2023-12-19 DIAGNOSIS — I340 Nonrheumatic mitral (valve) insufficiency: Secondary | ICD-10-CM

## 2023-12-19 DIAGNOSIS — I10 Essential (primary) hypertension: Secondary | ICD-10-CM

## 2023-12-19 DIAGNOSIS — I519 Heart disease, unspecified: Secondary | ICD-10-CM

## 2023-12-19 DIAGNOSIS — I361 Nonrheumatic tricuspid (valve) insufficiency: Secondary | ICD-10-CM

## 2023-12-19 DIAGNOSIS — I371 Nonrheumatic pulmonary valve insufficiency: Secondary | ICD-10-CM | POA: Diagnosis not present

## 2023-12-19 DIAGNOSIS — I5033 Acute on chronic diastolic (congestive) heart failure: Secondary | ICD-10-CM

## 2023-12-19 DIAGNOSIS — R0602 Shortness of breath: Secondary | ICD-10-CM

## 2023-12-19 DIAGNOSIS — I2510 Atherosclerotic heart disease of native coronary artery without angina pectoris: Secondary | ICD-10-CM

## 2023-12-21 ENCOUNTER — Ambulatory Visit: Admitting: Cardiovascular Disease

## 2023-12-24 ENCOUNTER — Ambulatory Visit: Admitting: Cardiovascular Disease

## 2023-12-24 ENCOUNTER — Encounter: Payer: Self-pay | Admitting: Cardiovascular Disease

## 2023-12-24 VITALS — BP 118/86 | HR 63 | Ht 75.0 in | Wt 225.4 lb

## 2023-12-24 DIAGNOSIS — I2510 Atherosclerotic heart disease of native coronary artery without angina pectoris: Secondary | ICD-10-CM | POA: Diagnosis not present

## 2023-12-24 DIAGNOSIS — R0602 Shortness of breath: Secondary | ICD-10-CM | POA: Diagnosis not present

## 2023-12-24 DIAGNOSIS — I10 Essential (primary) hypertension: Secondary | ICD-10-CM

## 2023-12-24 DIAGNOSIS — I5033 Acute on chronic diastolic (congestive) heart failure: Secondary | ICD-10-CM

## 2023-12-24 NOTE — Progress Notes (Signed)
 Cardiology Office Note   Date:  12/24/2023   ID:  Bryan Shea, Bryan Shea [birth year], MRN [id number]  PCP:  Bryan Nancyann [name], MD  Cardiologist:  Denyse Bathe, MD      History of Present Illness: Bryan Shea is a 68 y.o. male who presents for  Chief Complaint  Patient presents with   Results    Echo results and follow up    Feeling fine      Past Medical History:  Diagnosis Date   Hepatitis C virus    History of chicken pox    Hypertension    Testicular cancer [facility])      Past Surgical History:  Procedure Laterality Date   CARDIAC CATHETERIZATION Right 09/28/2015   Procedure: Left Heart Cath and Coronary Angiography;  Surgeon: Denyse DELENA Bathe, MD;  Location: [facility] INVASIVE CV LAB;  Service: Cardiovascular;  Laterality: Right;   COLONOSCOPY WITH PROPOFOL  N/A 04/29/2021   Procedure: COLONOSCOPY WITH PROPOFOL ;  Surgeon: Maryruth Ole DASEN, MD;  Location: [facility] ENDOSCOPY;  Service: Endoscopy;  Laterality: N/A;   LIVER BIOPSY  11/11/2009   Chronic Hepatitis. Graed 3. Early stage 2.5% steatosis. Focal ironidentified in Kupffer cells   TESTICLE REMOVAL  1994   testicular mass excision     Current Outpatient Medications  Medication Sig Dispense Refill   aspirin  81 MG EC tablet Take 1 tablet (81 mg total) by mouth daily. 30 tablet 0   atorvastatin  (LIPITOR) 40 MG tablet Take 1 tablet (40 mg total) by mouth daily. 60 tablet 3   carvedilol  (COREG ) 25 MG tablet TAKE 1 TABLET BY MOUTH 2 TIMES A DAY WITH A MEAL 180 tablet 3   Coenzyme Q10 300 MG CAPS Take 1 capsule by mouth daily.     hydrALAZINE  (APRESOLINE ) 25 MG tablet Take 4 tablets (100 mg total) by mouth 2 (two) times daily. 60 tablet 3   lisinopril  (ZESTRIL ) 40 MG tablet Take 1 tablet (40 mg total) by mouth daily. 90 tablet 1   Multiple Vitamin (MULTIVITAMIN) capsule Take 1 capsule by mouth daily.     Omega-3 Fatty Acids (FISH OIL) 1000 MG CAPS Take 1 capsule by mouth daily.     spironolactone  (ALDACTONE ) 25 MG  tablet Take 1 tablet (25 mg total) by mouth daily. 90 tablet 1   No current facility-administered medications for this visit.    Allergies:   Patient has no known allergies.    Social History:   reports that he has never smoked. He has never used smokeless tobacco. He reports current alcohol use. He reports that he does not use drugs.   Family History:  family history includes Breast cancer in his mother; Heart failure in his father.    ROS:     Review of Systems  Constitutional: Negative.   HENT: Negative.    Eyes: Negative.   Respiratory: Negative.    Gastrointestinal: Negative.   Genitourinary: Negative.   Musculoskeletal: Negative.   Skin: Negative.   Neurological: Negative.   Endo/Heme/Allergies: Negative.   Psychiatric/Behavioral: Negative.    All other systems reviewed and are negative.     All other systems are reviewed and negative.    PHYSICAL EXAM: VS:  BP 118/86   Pulse 63   Ht 6' 3 (1.905 m)   Wt 225 lb 6.4 oz (102.2 kg)   SpO2 97%   BMI 28.17 kg/m  , BMI Body mass index is 28.17 kg/m. Last weight:  Wt Readings from Last 3 Encounters:  12/24/23 225 lb 6.4 oz (102.2 kg)  10/29/23 229 lb (103.9 kg)  08/31/23 220 lb (99.8 kg)     Physical Exam Vitals reviewed.  Constitutional:      Appearance: Normal appearance. He is normal weight.  HENT:     Head: Normocephalic.     Nose: Nose normal.     Mouth/Throat:     Mouth: Mucous membranes are moist.   Eyes:     Pupils: Pupils are equal, round, and reactive to light.    Cardiovascular:     Rate and Rhythm: Normal rate and regular rhythm.     Pulses: Normal pulses.     Heart sounds: Normal heart sounds.  Pulmonary:     Effort: Pulmonary effort is normal.  Abdominal:     General: Abdomen is flat. Bowel sounds are normal.   Musculoskeletal:        General: Normal range of motion.     Cervical back: Normal range of motion.   Skin:    General: Skin is warm.   Neurological:     General: No  focal deficit present.     Mental Status: He is alert.   Psychiatric:        Mood and Affect: Mood normal.       EKG:   Recent Labs: 10/29/2023: ALT 19; BUN 25; Creatinine, Ser 1.36; Hemoglobin 12.6; Platelets 204; Potassium 5.0; Sodium 140    Lipid Panel    Component Value Date/Time   CHOL 110 10/29/2023 1010   TRIG 152 (H) 10/29/2023 1010   HDL 36 (L) 10/29/2023 1010   CHOLHDL 3.1 10/29/2023 1010   CHOLHDL 3.5 09/29/2015 0437   VLDL 49 (H) 09/29/2015 0437   LDLCALC 48 10/29/2023 1010      Other studies Reviewed: Additional studies/ records that were reviewed today include:  Review of the above records demonstrates:       No data to display            ASSESSMENT AND PLAN:    ICD-10-CM   1. Essential hypertension, malignant  I10     2. Shortness of breath  R06.02     3. Coronary artery disease involving native coronary artery of native heart without angina pectoris  I25.10     4. Acute on chronic diastolic heart failure (HCC)  P49.66    Feels better with GDMT. ECHO had normal EF, mild MR       Problem List Items Addressed This Visit       Cardiovascular and Mediastinum   CAD (coronary artery disease)   Other Visit Diagnoses       Essential hypertension, malignant    -  Primary     Shortness of breath         Acute on chronic diastolic heart failure (HCC)       Feels better with GDMT. ECHO had normal EF, mild MR          Disposition:   Return in about 3 months (around 03/25/2024).    Total time spent: 35 minutes  Signed,  Denyse Bathe, MD  12/24/2023 11:35 AM    [facility]

## 2024-02-15 DIAGNOSIS — M170 Bilateral primary osteoarthritis of knee: Secondary | ICD-10-CM | POA: Diagnosis not present

## 2024-02-21 ENCOUNTER — Other Ambulatory Visit: Payer: Self-pay | Admitting: Cardiovascular Disease

## 2024-02-21 DIAGNOSIS — I10 Essential (primary) hypertension: Secondary | ICD-10-CM

## 2024-02-21 DIAGNOSIS — I2510 Atherosclerotic heart disease of native coronary artery without angina pectoris: Secondary | ICD-10-CM

## 2024-02-21 DIAGNOSIS — I519 Heart disease, unspecified: Secondary | ICD-10-CM

## 2024-03-21 DIAGNOSIS — Z01818 Encounter for other preprocedural examination: Secondary | ICD-10-CM | POA: Diagnosis not present

## 2024-03-21 DIAGNOSIS — Z01812 Encounter for preprocedural laboratory examination: Secondary | ICD-10-CM | POA: Diagnosis not present

## 2024-03-21 DIAGNOSIS — M1711 Unilateral primary osteoarthritis, right knee: Secondary | ICD-10-CM | POA: Diagnosis not present

## 2024-03-25 DIAGNOSIS — Z01818 Encounter for other preprocedural examination: Secondary | ICD-10-CM | POA: Diagnosis not present

## 2024-03-28 ENCOUNTER — Ambulatory Visit: Admitting: Cardiovascular Disease

## 2024-04-11 DIAGNOSIS — M1711 Unilateral primary osteoarthritis, right knee: Secondary | ICD-10-CM | POA: Diagnosis not present

## 2024-04-15 DIAGNOSIS — M25661 Stiffness of right knee, not elsewhere classified: Secondary | ICD-10-CM | POA: Diagnosis not present

## 2024-04-15 DIAGNOSIS — M25561 Pain in right knee: Secondary | ICD-10-CM | POA: Diagnosis not present

## 2024-04-17 DIAGNOSIS — M25661 Stiffness of right knee, not elsewhere classified: Secondary | ICD-10-CM | POA: Diagnosis not present

## 2024-04-17 DIAGNOSIS — M25561 Pain in right knee: Secondary | ICD-10-CM | POA: Diagnosis not present

## 2024-04-18 ENCOUNTER — Ambulatory Visit: Admitting: Cardiovascular Disease

## 2024-04-22 DIAGNOSIS — M25561 Pain in right knee: Secondary | ICD-10-CM | POA: Diagnosis not present

## 2024-04-22 DIAGNOSIS — M25661 Stiffness of right knee, not elsewhere classified: Secondary | ICD-10-CM | POA: Diagnosis not present

## 2024-06-22 ENCOUNTER — Other Ambulatory Visit: Payer: Self-pay | Admitting: Cardiovascular Disease

## 2024-06-22 DIAGNOSIS — I10 Essential (primary) hypertension: Secondary | ICD-10-CM

## 2024-06-22 DIAGNOSIS — I2510 Atherosclerotic heart disease of native coronary artery without angina pectoris: Secondary | ICD-10-CM
